# Patient Record
Sex: Male | Born: 1995 | Hispanic: Yes | Marital: Single | Smoking: Never smoker
Health system: Southern US, Community
[De-identification: ages and names within clinical notes are randomized; demographics above are authoritative.]

## PROBLEM LIST (undated history)

## (undated) DIAGNOSIS — G809 Cerebral palsy, unspecified: Secondary | ICD-10-CM

---
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
## 2014-06-22 DIAGNOSIS — Z9889 Other specified postprocedural states: Secondary | ICD-10-CM | POA: Insufficient documentation

## 2014-08-01 DIAGNOSIS — F331 Major depressive disorder, recurrent, moderate: Secondary | ICD-10-CM | POA: Insufficient documentation

## 2014-11-08 ENCOUNTER — Emergency Department (HOSPITAL_COMMUNITY)
Admission: EM | Admit: 2014-11-08 | Discharge: 2014-11-08 | Disposition: A | Discharge status: Home / Self Care | Payer: Medicare Other | Attending: Emergency Medicine | Admitting: Emergency Medicine

## 2014-11-08 ENCOUNTER — Encounter (HOSPITAL_COMMUNITY): Payer: Self-pay | Admitting: Emergency Medicine

## 2014-11-08 DIAGNOSIS — Y9289 Other specified places as the place of occurrence of the external cause: Secondary | ICD-10-CM | POA: Insufficient documentation

## 2014-11-08 DIAGNOSIS — Y288XXA Contact with other sharp object, undetermined intent, initial encounter: Secondary | ICD-10-CM | POA: Insufficient documentation

## 2014-11-08 DIAGNOSIS — Z79899 Other long term (current) drug therapy: Secondary | ICD-10-CM | POA: Insufficient documentation

## 2014-11-08 DIAGNOSIS — Y9389 Activity, other specified: Secondary | ICD-10-CM | POA: Insufficient documentation

## 2014-11-08 DIAGNOSIS — S3121XA Laceration without foreign body of penis, initial encounter: Secondary | ICD-10-CM | POA: Diagnosis present

## 2014-11-08 DIAGNOSIS — Z8669 Personal history of other diseases of the nervous system and sense organs: Secondary | ICD-10-CM | POA: Insufficient documentation

## 2014-11-08 DIAGNOSIS — Y998 Other external cause status: Secondary | ICD-10-CM | POA: Insufficient documentation

## 2014-11-08 NOTE — ED Notes (Signed)
Pt here with mother per mother small laceration to pts penis; mother sts some bleeding noted; pt with hx of CP and is non verbal

## 2014-11-08 NOTE — ED Notes (Signed)
  Bacitracin applied, lac covered with gauze.

## 2014-11-08 NOTE — ED Provider Notes (Signed)
CSN: [id number]     Arrival date & time 11/08/14  1312 History   First MD Initiated Contact with Patient 11/08/14 1327     Chief Complaint  Patient presents with  . Laceration     (Consider location/radiation/quality/duration/timing/severity/associated sxs/prior Treatment) Patient is a 19 y.o. male presenting with skin laceration. The history is provided by a parent. No language interpreter was used.  Laceration Location:  Pelvis Pelvic laceration location:  Penis Length (cm):  2.5cm Depth:  Cutaneous Quality: straight   Bleeding: controlled   Time since incident:  1 hour Injury mechanism: edge of the toilet. Pain details:    Quality:  Burning   Severity:  Mild   Timing:  Constant   Progression:  Unchanged Foreign body present:  No foreign bodies Relieved by:  Nothing Worsened by:  Nothing tried Ineffective treatments:  None tried Tetanus status:  Unknown   Past Medical History  Diagnosis Date  . Cerebral palsy    History reviewed. No pertinent past surgical history. History reviewed. No pertinent family history. History  Substance Use Topics  . Smoking status: Never Smoker   . Smokeless tobacco: Never Used  . Alcohol Use: No    Review of Systems  Constitutional: Negative for fever, chills and fatigue.  HENT: Negative for trouble swallowing.   Eyes: Negative for visual disturbance.  Respiratory: Negative for shortness of breath.   Cardiovascular: Negative for chest pain and palpitations.  Gastrointestinal: Negative for nausea, vomiting, abdominal pain and diarrhea.  Genitourinary: Negative for dysuria and difficulty urinating.  Musculoskeletal: Negative for arthralgias and neck pain.  Skin: Positive for wound. Negative for color change.  Neurological: Negative for dizziness and weakness.  Psychiatric/Behavioral: Negative for dysphoric mood.      Allergies  Review of patient's allergies indicates no known allergies.  Home Medications   Prior to  Admission medications   Medication Sig Start Date End Date Taking? Authorizing Provider  ondansetron (ZOFRAN-ODT) 4 MG disintegrating tablet Take 4 mg by mouth every 8 (eight) hours as needed. For nausea    Historical Provider, MD  PRESCRIPTION MEDICATION Take 2 mLs by mouth 3 (three) times daily. Baclofen susp. 5mg /971ml    Historical Provider, MD   BP 141/75 mmHg  Pulse 113  Temp(Src) 97.5 F (36.4 C) (Oral)  Resp 18  SpO2 96% Physical Exam  Constitutional: He is oriented to person, place, and time. He appears well-developed and well-nourished. No distress.  HENT:  Head: Normocephalic and atraumatic.  Eyes: Conjunctivae and EOM are normal.  Neck: Normal range of motion.  Cardiovascular: Normal rate and regular rhythm.  Exam reveals no gallop and no friction rub.   No murmur heard. Pulmonary/Chest: Effort normal and breath sounds normal. He has no wheezes. He has no rales. He exhibits no tenderness.  Abdominal: Soft. He exhibits no distension. There is no tenderness. There is no rebound.  Genitourinary: Testes normal and penis normal.    Circumcised.  2.5cm superficial laceration to penile shaft with bleeding controlled.   Musculoskeletal: Normal range of motion.  Neurological: He is alert and oriented to person, place, and time. Coordination normal.  Speech is goal-oriented. Moves limbs without ataxia.   Skin: Skin is warm and dry.  Psychiatric: He has a normal mood and affect. His behavior is normal.  Nursing note and vitals reviewed.   ED Course  Procedures (including critical care time) Labs Review Labs Reviewed - No data to display  Imaging Review No results found.   EKG Interpretation None  MDM   Final diagnoses:  Laceration of penis, initial encounter    1:59 PM Patient's laceration is superficial with bleeding controlled at this time. No repair needed. Laceration will be cleaned and bacitracin applied. Patient's mother understands the wound will heal  quickly on its own due to vascular area.     Emilia Beck, PA-C 11/08/14 1405  Vanetta Mulders, MD 11/08/14 1420

## 2014-11-08 NOTE — Discharge Instructions (Signed)
Keep wound clean and apply bacitracin to prevent infection. Follow up with the Primary care provider as needed. Return to the ED with worsening or concerning symptoms.

## 2014-11-16 ENCOUNTER — Telehealth: Payer: Self-pay | Admitting: *Deleted

## 2014-11-16 NOTE — Telephone Encounter (Signed)
NPI # given for One time authorization given to [facility], patient to get card changed.

## 2015-05-16 DIAGNOSIS — H0015 Chalazion left lower eyelid: Secondary | ICD-10-CM

## 2015-05-16 HISTORY — DX: Chalazion left lower eyelid: H00.15

## 2016-02-09 ENCOUNTER — Emergency Department (HOSPITAL_COMMUNITY)
Admission: EM | Admit: 2016-02-09 | Discharge: 2016-02-09 | Disposition: A | Discharge status: Home / Self Care | Payer: Medicaid Other | Attending: Emergency Medicine | Admitting: Emergency Medicine

## 2016-02-09 ENCOUNTER — Emergency Department (HOSPITAL_COMMUNITY): Payer: Medicaid Other

## 2016-02-09 ENCOUNTER — Encounter (HOSPITAL_COMMUNITY): Payer: Self-pay | Admitting: *Deleted

## 2016-02-09 DIAGNOSIS — R0602 Shortness of breath: Secondary | ICD-10-CM | POA: Insufficient documentation

## 2016-02-09 DIAGNOSIS — R110 Nausea: Secondary | ICD-10-CM

## 2016-02-09 DIAGNOSIS — R1084 Generalized abdominal pain: Secondary | ICD-10-CM

## 2016-02-09 DIAGNOSIS — R112 Nausea with vomiting, unspecified: Secondary | ICD-10-CM | POA: Diagnosis not present

## 2016-02-09 DIAGNOSIS — R079 Chest pain, unspecified: Secondary | ICD-10-CM | POA: Insufficient documentation

## 2016-02-09 DIAGNOSIS — N3000 Acute cystitis without hematuria: Secondary | ICD-10-CM

## 2016-02-09 DIAGNOSIS — R1012 Left upper quadrant pain: Secondary | ICD-10-CM | POA: Diagnosis present

## 2016-02-09 DIAGNOSIS — R197 Diarrhea, unspecified: Secondary | ICD-10-CM | POA: Diagnosis not present

## 2016-02-09 DIAGNOSIS — K5900 Constipation, unspecified: Secondary | ICD-10-CM

## 2016-02-09 LAB — COMPREHENSIVE METABOLIC PANEL
ALK PHOS: 144 U/L — AB (ref 38–126)
ALT: 20 U/L (ref 17–63)
ANION GAP: 13 (ref 5–15)
AST: 28 U/L (ref 15–41)
Albumin: 4.3 g/dL (ref 3.5–5.0)
BUN: 15 mg/dL (ref 6–20)
CALCIUM: 9.7 mg/dL (ref 8.9–10.3)
CO2: 26 mmol/L (ref 22–32)
Chloride: 103 mmol/L (ref 101–111)
Creatinine, Ser: 0.83 mg/dL (ref 0.61–1.24)
GFR calc non Af Amer: >60 mL/min (ref >60)
Glucose, Bld: 116 mg/dL — ABNORMAL HIGH (ref 65–99)
Potassium: 3.4 mmol/L — ABNORMAL LOW (ref 3.5–5.1)
SODIUM: 142 mmol/L (ref 135–145)
TOTAL PROTEIN: 8 g/dL (ref 6.5–8.1)
Total Bilirubin: 0.9 mg/dL (ref 0.3–1.2)

## 2016-02-09 LAB — URINALYSIS, ROUTINE W REFLEX MICROSCOPIC
Bilirubin Urine: NEGATIVE
Glucose, UA: 100 mg/dL — AB
Ketones, ur: 15 mg/dL — AB
LEUKOCYTES UA: NEGATIVE
Nitrite: NEGATIVE
PROTEIN: 30 mg/dL — AB
Specific Gravity, Urine: 1.019 (ref 1.005–1.030)
pH: 7.5 (ref 5.0–8.0)

## 2016-02-09 LAB — CBC
HCT: 50.6 % (ref 39.0–52.0)
Hemoglobin: 17.1 g/dL — ABNORMAL HIGH (ref 13.0–17.0)
MCH: 28.5 pg (ref 26.0–34.0)
MCHC: 33.8 g/dL (ref 30.0–36.0)
MCV: 84.5 fL (ref 78.0–100.0)
PLATELETS: 188 10*3/uL (ref 150–400)
RBC: 5.99 MIL/uL — AB (ref 4.22–5.81)
RDW: 13.1 % (ref 11.5–15.5)
WBC: 9.1 10*3/uL (ref 4.0–10.5)

## 2016-02-09 LAB — URINE MICROSCOPIC-ADD ON: WBC UA: NONE SEEN WBC/hpf (ref 0–5)

## 2016-02-09 LAB — TROPONIN I

## 2016-02-09 LAB — LIPASE, BLOOD: LIPASE: 28 U/L (ref 11–51)

## 2016-02-09 MED ORDER — CEFTRIAXONE SODIUM 1 G IJ SOLR
1.0000 g | Freq: Once | INTRAMUSCULAR | Status: AC
  Administered 2016-02-09: 1 g via INTRAVENOUS
  Filled 2016-02-09: qty 10

## 2016-02-09 MED ORDER — CEPHALEXIN 500 MG PO CAPS: 500.0000 mg | ORAL_CAPSULE | Freq: Four times a day (QID) | ORAL | Status: DC

## 2016-02-09 MED ORDER — IBUPROFEN 800 MG PO TABS: 800.0000 mg | ORAL_TABLET | Freq: Three times a day (TID) | ORAL | Status: DC

## 2016-02-09 MED ORDER — IBUPROFEN 100 MG/5ML PO SUSP
400.0000 mg | Freq: Once | ORAL | Status: AC
  Administered 2016-02-09: 400 mg via ORAL
  Filled 2016-02-09: qty 20

## 2016-02-09 MED ORDER — SODIUM CHLORIDE 0.9 % IV BOLUS (SEPSIS)
1000.0000 mL | Freq: Once | INTRAVENOUS | Status: AC
  Administered 2016-02-09: 1000 mL via INTRAVENOUS

## 2016-02-09 MED ORDER — ONDANSETRON 4 MG PO TBDP: 4.0000 mg | ORAL_TABLET | Freq: Three times a day (TID) | ORAL | Status: DC | PRN

## 2016-02-09 NOTE — ED Notes (Signed)
abd and chest pain with emesis for 3-4 days.  He has been seen at [facility] for the same   He has been constipated.  Last bm this am was diarrhea.Marland Kitchen.  Hx constipatedhe has cerebral palsy  W/c bound

## 2016-02-09 NOTE — Discharge Instructions (Signed)
You have been seen today for abdominal discomfort, constipation, and nausea. Your imaging showed no abnormalities, but her urine shows evidence of urinary tract infection. Please take all of your antibiotics until finished!   You may develop abdominal discomfort or diarrhea from the antibiotic.  You may help offset this with probiotics which you can buy or get in yogurt. Do not eat or take the probiotics until 2 hours after your antibiotic. Your other symptoms are consistent with a viral illness. Viruses do not require antibiotics (the antibiotic prescribed today is for the UTI). Treatment is symptomatic care. Drink plenty of fluids and get plenty of rest. You should be drinking at least a liter of water an hour to stay hydrated. Ibuprofen or Tylenol for pain or fever. Zofran for nausea. Follow up with PCP as needed should symptoms continue. Return to ED should symptoms worsen.  RESOURCE GUIDE  Chronic Pain Problems: Contact [facility]  [phone] Patients need to be referred by their primary care doctor.  Insufficient Money for Medicine: Contact United Way:  call "211" or [facility] [phone].  No Primary Care Doctor: - Call Health Connect  [phone] - can help you locate a primary care doctor that  accepts your insurance, provides certain services, etc. - Physician Referral Service- [phone]  Agencies that provide inexpensive medical care: - [facility]  [phone] - [facility]  [phone] - [facility]  [phone] - Women's Clinic  [phone] - Planned Parenthood  [phone] Haynes Bast[facility]  [phone]  Medicaid-accepting [facility] Providers: - [facility]- [address], [address]  [phone], Mon-Fri 9am-7pm, Sat 9am-1pm - [facility]- [address], [address] [place]  [phone] - [facility]- [address], [address]  [place]  [phone] [facility]- [address] [place] [address]  [phone] - Renaye RakersVeita Bland- [address], [address], [phone]  Only accepts [place] patients after they have their name  applied to their card  Self Pay (no insurance) in [place]: - Sickle Cell Patients: Dr Willey BladeEric Dean, [facility]  [address], [phone] - [facility]- [address]  [phone]       Redge Gainer-     [facility]- [address], [address]       -     [facility]- see information above (Speak to CitigroupPam H if you do not have insurance)       -  [facility]- [address], [phone]       -  [facility]- [address],  [phone]       -  [facility]- [address] [place] [address], [phone]       -  Dr Julio Sickssei-Bonsu-  [address], [address], [place], [phone]       -  [facility]- [address], [phone]       -  [facility]- [address] [place] [address], [phone], also [address], [phone]       -    [facility]- [address], [phone], 1st & 3rd Saturday   every month, 10am-1pm  1) Find a Doctor and Pay Out of Pocket Although you won't have to find out who is covered by your insurance plan, it  is a good idea to ask around and get recommendations. You will then need to call the office and see if the doctor you have chosen will accept you as a new patient and what types of options they offer for patients who are self-pay. Some doctors offer discounts or will set up payment plans for their patients who do not have insurance, but you will need to ask so you aren't surprised when you get to your appointment.  2) Contact Your Local Health Department Not all health departments have doctors that can see patients for sick visits, but many do, so it is worth a call to see if yours does. If you don't know where your local health department is, you can check in  your phone book. The CDC also has a tool to help you locate your state's health department, and many state websites also have listings of all of their local health departments.  3) Find a Walk-in Clinic If your illness is not likely to be very severe or complicated, you may want to try a walk in clinic. These are popping up all over the country in pharmacies, drugstores, and shopping centers. They're usually staffed by nurse practitioners or physician assistants that have been trained to treat common illnesses and complaints. They're usually fairly quick and inexpensive. However, if you have serious medical issues or chronic medical problems, these are probably not your best option  STD Testing - [facility] Department of [facility], STD Clinic, [address], [place], phone [phone] or [phone].  Monday - Friday, call for an appointment. [facility] Department of [employer], STD Clinic, [address], [place], phone [phone] or [phone].  Monday - Friday, call for an appointment.  Abuse/Neglect: [facility] Child Abuse Hotline 825-239-4835 [facility] Child Abuse Hotline [phone] (After Hours)  Emergency Shelter:  [facility] 612-395-1687  Maternity Homes: - Room at the [place] [facility] [phone] - [facility] [phone]  MRSA Hotline #:   3438876814  [facility] Resources  [facility] [place]  [facility] Dept. [address]                 [address]         [address]  Blondell Reveal Phone:  [phone]                                  Phone:  [phone]                   Phone:  [phone]  [facility], [phone] - [facility] - [phone]       -     [facility] in [place], [address]  [phone], [place] [phone] or [phone] (After Hours)   [place]  Substance Abuse Resources: - [facility] and Drug Services  [phone] - [place] [phone] - The [facility] [place] [phone] - Residential & Outpatient Substance Abuse Program  [phone]  Psychological Services: - [place], [place] [address], [place], [place]: [phone] or [phone], PicCapture.uy  Dental Assistance  If unable to pay or uninsured, contact:  [facility] or [facility]. to become qualified for the adult dental clinic.  Patients with Medicaid: [facility] [phone] [address], [place] [address] [phone]  If unable to pay, or uninsured, contact [facility] [phone]) or [facility] [phone] in [place], [place] in [facility]) to become qualified for the adult dental clinic   Other [place]- [place], [place], [place], [address], [place], [place], 2nd and 4th Thursday of the month at 6:30am.  10 clients each day by appointment, can sometimes see walk-in patients if someone does not show for an appointment. [facility]- [address] [name], [place], [address], [place], [place], [place], [address], [place] Department- [place] Department- [place] Department- [phone]

## 2016-02-09 NOTE — ED Notes (Signed)
MD at bedside. 

## 2016-02-09 NOTE — ED Provider Notes (Signed)
CSN: [id number]     Arrival date & time 02/09/16  9562 History   First MD Initiated Contact with Patient 02/09/16 0606     Chief Complaint  Patient presents with  . Emesis     (Consider location/radiation/quality/duration/timing/severity/associated sxs/prior Treatment) HPI   Miguel Myers is a 20 y.o. male, with a history of cerebral palsy, presenting to the ED with abdominal pain that began 4 days ago. Accompanied by loose stools and nausea that began last night. Pt rates his pain at 8/10, sharp, located on the left upper and lower quadrants, and radiates across the abdomen. Also accompanied by decreased appetite and oral intake. Pt was seen recently for this problem on April [facility] at [facility]. Pt has a baclofen pump, meant to relax his muscles. This pump was evaluated and found to be in its proper place. Found to be constipated at that time. Enemas and magnesium recommended. Last enema was last night at 8:30 PM. Patient's mother, at the bedside, states pt had these same symptoms when he was around 20 years old and was found to have an ileus. Her mother is concerned that this is happening again. Pt is urinating normally. Denies vomiting, fever/chills, cough, current shortness of breath or chest pain, dysuria, or any other complaints. Patient has had multiple abdominal surgeries.   Past Medical History  Diagnosis Date  . Cerebral palsy (HCC)    History reviewed. No pertinent past surgical history. No family history on file. Social History  Substance Use Topics  . Smoking status: Never Smoker   . Smokeless tobacco: Never Used  . Alcohol Use: No    Review of Systems  Constitutional: Negative for fever and chills.  Respiratory: Positive for shortness of breath.   Cardiovascular: Positive for chest pain.  Gastrointestinal: Positive for nausea, vomiting, abdominal pain, diarrhea and constipation. Negative for blood in stool.  All other systems reviewed and are negative.     Allergies  Review  of patient's allergies indicates no known allergies.  Home Medications   Prior to Admission medications   Medication Sig Start Date End Date Taking? Authorizing Provider  baclofen (LIORESAL) 0.05 MG/ML injection by Intrathecal route. 07/19/15  Yes Historical Provider, MD  mirtazapine (REMERON) 7.5 MG tablet Take 0.5 tablets by mouth every evening. 01/11/16  Yes Historical Provider, MD  ondansetron (ZOFRAN-ODT) 4 MG disintegrating tablet Take 4 mg by mouth every 8 (eight) hours as needed. For nausea   Yes Historical Provider, MD  polyethylene glycol powder (MIRALAX) powder Take 1 Dose by mouth as needed. constipation 05/05/11  Yes Historical Provider, MD  cephALEXin (KEFLEX) 500 MG capsule Take 1 capsule (500 mg total) by mouth 4 (four) times daily. 02/09/16   [name] [name], PA-C  ibuprofen (ADVIL,MOTRIN) 800 MG tablet Take 1 tablet (800 mg total) by mouth 3 (three) times daily. 02/09/16   [name] [name], PA-C  ondansetron (ZOFRAN ODT) 4 MG disintegrating tablet Take 1 tablet (4 mg total) by mouth every 8 (eight) hours as needed for nausea or vomiting. 02/09/16   [name] [name], PA-C   BP 125/84 mmHg  Pulse 100  Temp(Src) 97.7 F (36.5 C) (Oral)  Resp 17  Ht  (1.473 m)  Wt 63.504 kg  BMI 29.27 kg/m2  SpO2 99% Physical Exam  Constitutional: He is oriented to person, place, and time. He appears well-developed and well-nourished. No distress.  HENT:  Head: Normocephalic and atraumatic.  Eyes: Conjunctivae are normal. Pupils are equal, round, and reactive to light.  Neck: Neck supple.  Cardiovascular: Normal rate, regular rhythm, normal heart sounds and intact distal pulses.   Pulmonary/Chest: Effort normal and breath sounds normal. No respiratory distress. He exhibits no tenderness.  Abdominal: Soft. Normal appearance. There is tenderness in the left upper quadrant and left lower quadrant. There is no guarding.  Mild abdominal tenderness.  Musculoskeletal: He exhibits no edema or tenderness.   Lymphadenopathy:    He has no cervical adenopathy.  Neurological: He is alert and oriented to person, place, and time.  Skin: Skin is warm and dry. He is not diaphoretic.  Psychiatric: He has a normal mood and affect. His behavior is normal.  Nursing note and vitals reviewed.   ED Course  Procedures (including critical care time) Labs Review Labs Reviewed  COMPREHENSIVE METABOLIC PANEL - Abnormal; Notable for the following:    Potassium 3.4 (*)    Glucose, Bld 116 (*)    Alkaline Phosphatase 144 (*)    All other components within normal limits  CBC - Abnormal; Notable for the following:    RBC 5.99 (*)    Hemoglobin 17.1 (*)    All other components within normal limits  URINALYSIS, ROUTINE W REFLEX MICROSCOPIC (NOT AT [facility]) - Abnormal; Notable for the following:    APPearance TURBID (*)    Glucose, UA 100 (*)    Hgb urine dipstick SMALL (*)    Ketones, ur 15 (*)    Protein, ur 30 (*)    All other components within normal limits  URINE MICROSCOPIC-ADD ON - Abnormal; Notable for the following:    Squamous Epithelial / LPF 0-5 (*)    Bacteria, UA MANY (*)    All other components within normal limits  URINE CULTURE  LIPASE, BLOOD  TROPONIN I    Imaging Review Dg Chest 1 View  02/09/2016  CLINICAL DATA:  Chest pain and nausea.  Cerebral palsy. EXAM: CHEST 1 VIEW COMPARISON:  None. FINDINGS: Normal sized heart. Clear lungs. Poor inspiration. Unremarkable bones. IMPRESSION: No acute abnormality. Electronically Signed   By: Beckie Salts M.D.   On: 02/09/2016 07:40   Dg Abd 1 View  02/09/2016  CLINICAL DATA:  Chest pain and nausea.  Cerebral palsy. EXAM: ABDOMEN - 1 VIEW COMPARISON:  None. FINDINGS: Dysplastic pelvic bones and hips compatible with the history of cerebral palsy. Metallic device overlying the left lower abdomen and upper pelvis with a small lead or catheter extending toward the spine. Normal bowel gas pattern. IMPRESSION: No acute abnormality. Electronically Signed    By: Beckie Salts M.D.   On: 02/09/2016 07:39   I have personally reviewed and evaluated these images and lab results as part of my medical decision-making.   EKG Interpretation   Date/Time:  Saturday February 09 2016 05:39:43 EDT Ventricular Rate:  107 PR Interval:  128 QRS Duration: 76 QT Interval:  336 QTC Calculation: 448 R Axis:   96 Text Interpretation:  Sinus tachycardia Rightward axis Borderline ECG  Confirmed by Lincoln Brigham [id number]) on 02/09/2016 8:03:48 AM      Medications  sodium chloride 0.9 % bolus 1,000 mL (0 mLs Intravenous Stopped 02/09/16 0817)  cefTRIAXone (ROCEPHIN) 1 g in dextrose 5 % 50 mL IVPB (0 g Intravenous Stopped 02/09/16 0847)  ibuprofen (ADVIL,MOTRIN) 100 MG/5ML suspension 400 mg (400 mg Oral Given 02/09/16 0918)   Orders Placed This Encounter  Procedures  . Urine culture  . DG Abd 1 View  . DG Chest 1 View  . Lipase, blood  .  Comprehensive metabolic panel  . CBC  . Urinalysis, Routine w reflex microscopic (not at [facility])  . Troponin I  . Urine microscopic-add on  . Diet NPO time specified  . Saline Lock IV, Maintain IV access  . EKG 12-Lead  . ED EKG    MDM   Final diagnoses:  Constipation, unspecified constipation type  Generalized abdominal pain  Acute cystitis without hematuria    Peter GarterKevin Scrivener presents with abdominal pain and constipation for the last 3 days. Now accompanied by soft stools.  Findings and plan of care discussed with Tilden FossaElizabeth Rees, MD. Dr. Madilyn Hookees personally evaluated and examined this patient.  Suspect the patient's soft stools are due to the enema use. Signs of UTI on UA acknowledged and treatment initiated. X-rays show no acute abnormalities. Suspect patient's discomfort is due to possible viral illness. Patient is nontoxic appearing, afebrile, not tachycardic on my exam, not tachypneic, maintains SPO2 of 97-99% on room air, and is in no apparent distress. Patient has no signs of sepsis or other serious or life-threatening  condition. Upon reassessment, patient states that he is no longer in pain. Patient currently denies any complaints. Patient to follow up with PCP should symptoms continue. Home care and return precautions discussed. Patient and patient's mother voice understanding of these instructions, accept the plan, and are comfortable with discharge.  Filed Vitals:   02/09/16 0645 02/09/16 0700 02/09/16 0703 02/09/16 0745  BP: 143/82 126/76  125/77  Pulse: 95 84  106  Temp:      Resp:      Height:      Weight:      SpO2: 99% 97% 99% 97%   Filed Vitals:   02/09/16 0845 02/09/16 0915 02/09/16 0925 02/09/16 0927  BP: 122/73 125/84    Pulse: 102 111 100   Temp:    97.7 F (36.5 C)  TempSrc:    Oral  Resp:   17   Height:      Weight:      SpO2: 99% 100% 99%         Anselm PancoastShawn C [name], PA-C 02/09/16 0930  Tilden FossaElizabeth Rees, MD 02/10/16 [id number]

## 2016-02-10 LAB — URINE CULTURE

## 2016-02-22 ENCOUNTER — Emergency Department (HOSPITAL_COMMUNITY): Payer: Medicaid Other

## 2016-02-22 ENCOUNTER — Encounter (HOSPITAL_COMMUNITY): Payer: Self-pay

## 2016-02-22 ENCOUNTER — Emergency Department (HOSPITAL_COMMUNITY)
Admission: EM | Admit: 2016-02-22 | Discharge: 2016-02-22 | Disposition: A | Discharge status: Home / Self Care | Payer: Medicaid Other | Attending: Emergency Medicine | Admitting: Emergency Medicine

## 2016-02-22 DIAGNOSIS — R079 Chest pain, unspecified: Secondary | ICD-10-CM | POA: Diagnosis present

## 2016-02-22 DIAGNOSIS — R000 Tachycardia, unspecified: Secondary | ICD-10-CM | POA: Diagnosis not present

## 2016-02-22 DIAGNOSIS — Z8669 Personal history of other diseases of the nervous system and sense organs: Secondary | ICD-10-CM | POA: Diagnosis not present

## 2016-02-22 DIAGNOSIS — R0789 Other chest pain: Secondary | ICD-10-CM | POA: Diagnosis not present

## 2016-02-22 LAB — BASIC METABOLIC PANEL
ANION GAP: 13 (ref 5–15)
BUN: 15 mg/dL (ref 6–20)
CHLORIDE: 104 mmol/L (ref 101–111)
CO2: 22 mmol/L (ref 22–32)
Calcium: 10 mg/dL (ref 8.9–10.3)
Creatinine, Ser: 0.84 mg/dL (ref 0.61–1.24)
GFR calc Af Amer: >60 mL/min (ref >60)
GLUCOSE: 111 mg/dL — AB (ref 65–99)
POTASSIUM: 3.5 mmol/L (ref 3.5–5.1)
SODIUM: 139 mmol/L (ref 135–145)

## 2016-02-22 LAB — CBC
HCT: 50 % (ref 39.0–52.0)
Hemoglobin: 17.1 g/dL — ABNORMAL HIGH (ref 13.0–17.0)
MCH: 28.9 pg (ref 26.0–34.0)
MCHC: 34.2 g/dL (ref 30.0–36.0)
MCV: 84.5 fL (ref 78.0–100.0)
Platelets: 204 10*3/uL (ref 150–400)
RBC: 5.92 MIL/uL — ABNORMAL HIGH (ref 4.22–5.81)
RDW: 12.8 % (ref 11.5–15.5)
WBC: 9.6 10*3/uL (ref 4.0–10.5)

## 2016-02-22 LAB — I-STAT TROPONIN, ED: TROPONIN I, POC: 0 ng/mL (ref 0.00–0.08)

## 2016-02-22 LAB — D-DIMER, QUANTITATIVE: D-Dimer, Quant: 0.27 ug/mL-FEU (ref 0.00–0.50)

## 2016-02-22 MED ORDER — ONDANSETRON 4 MG PO TBDP
4.0000 mg | ORAL_TABLET | Freq: Once | ORAL | Status: AC
  Administered 2016-02-22: 4 mg via ORAL
  Filled 2016-02-22: qty 1

## 2016-02-22 MED ORDER — FENTANYL CITRATE (PF) 100 MCG/2ML IJ SOLN
50.0000 ug | INTRAMUSCULAR | Status: DC | PRN
  Administered 2016-02-22: 50 ug via INTRAVENOUS
  Filled 2016-02-22: qty 2

## 2016-02-22 MED ORDER — SODIUM CHLORIDE 0.9 % IV BOLUS (SEPSIS)
1000.0000 mL | Freq: Once | INTRAVENOUS | Status: AC
  Administered 2016-02-22: 1000 mL via INTRAVENOUS

## 2016-02-22 NOTE — ED Provider Notes (Signed)
CSN: [id number]     Arrival date & time 02/22/16  1915 History   First MD Initiated Contact with Patient 02/22/16 1924     Chief Complaint  Patient presents with  . Chest Pain     (Consider location/radiation/quality/duration/timing/severity/associated sxs/prior Treatment) HPI Comments: 20 year old male with cerebral palsy presents with fast heart rate and chest pain. Chest pain worse with movement. Patient denies recent surgery, blood clot history, unilateral leg swelling. No fevers or chills or cough. Patient was recently seen in the ER for vomiting flank pain and possible urine infection. Patient states vomiting diarrhea and side pain resolved however anterior chest pain sharp intermittent is new. No cardiac history. No diaphoresis, nonradiating. 3 days.  Patient is a 20 y.o. male presenting with chest pain. The history is provided by the patient.  Chest Pain Associated symptoms: no abdominal pain, no back pain, no fever, no headache, no shortness of breath and not vomiting     Past Medical History  Diagnosis Date  . Cerebral palsy (HCC)    History reviewed. No pertinent past surgical history. History reviewed. No pertinent family history. Social History  Substance Use Topics  . Smoking status: Never Smoker   . Smokeless tobacco: Never Used  . Alcohol Use: No    Review of Systems  Constitutional: Negative for fever and chills.  HENT: Negative for congestion.   Eyes: Negative for visual disturbance.  Respiratory: Negative for shortness of breath.   Cardiovascular: Positive for chest pain.  Gastrointestinal: Negative for vomiting and abdominal pain.  Genitourinary: Negative for dysuria and flank pain.  Musculoskeletal: Negative for back pain, neck pain and neck stiffness.  Skin: Negative for rash.  Neurological: Negative for light-headedness and headaches.      Allergies  Review of patient's allergies indicates no known allergies.  Home Medications   Prior to  Admission medications   Medication Sig Start Date End Date Taking? Authorizing Provider  polyethylene glycol powder (MIRALAX) powder Take 0.5 Containers by mouth daily as needed for mild constipation. constipation 05/05/11  Yes Historical Provider, MD  cephALEXin (KEFLEX) 500 MG capsule Take 1 capsule (500 mg total) by mouth 4 (four) times daily. Patient not taking: Reported on 02/22/2016 02/09/16   [name] [name], PA-C  ibuprofen (ADVIL,MOTRIN) 800 MG tablet Take 1 tablet (800 mg total) by mouth 3 (three) times daily. Patient not taking: Reported on 02/22/2016 02/09/16   [name] [name], PA-C  ondansetron (ZOFRAN ODT) 4 MG disintegrating tablet Take 1 tablet (4 mg total) by mouth every 8 (eight) hours as needed for nausea or vomiting. Patient not taking: Reported on 02/22/2016 02/09/16   [name] [name], PA-C   BP 116/73 mmHg  Pulse 84  Temp(Src) 99.6 F (37.6 C) (Rectal)  Resp 13  Ht  (1.473 m)  Wt 140 lb (63.504 kg)  BMI 29.27 kg/m2  SpO2 96% Physical Exam  Constitutional: He is oriented to person, place, and time. He appears well-developed and well-nourished.  HENT:  Head: Normocephalic and atraumatic.  Eyes: Right eye exhibits no discharge. Left eye exhibits no discharge.  Neck: Normal range of motion. Neck supple. No tracheal deviation present.  Cardiovascular: Regular rhythm.  Tachycardia present.   Pulmonary/Chest: Effort normal and breath sounds normal.  Abdominal: Soft. He exhibits no distension. There is no tenderness. There is no guarding.  Musculoskeletal: He exhibits tenderness (tender chest wall). He exhibits no edema.  Neurological: He is alert and oriented to person, place, and time.  Skin: Skin is warm.  No rash noted.  Psychiatric: He has a normal mood and affect.  Nursing note and vitals reviewed.   ED Course  Procedures (including critical care time) Labs Review Labs Reviewed  BASIC METABOLIC PANEL - Abnormal; Notable for the following:    Glucose, Bld 111 (*)    All  other components within normal limits  CBC - Abnormal; Notable for the following:    RBC 5.92 (*)    Hemoglobin 17.1 (*)    All other components within normal limits  D-DIMER, QUANTITATIVE (NOT AT [facility])  Rosezena SensorI-STAT TROPOININ, ED  I-STAT CG4 LACTIC ACID, ED    Imaging Review Dg Chest 1 View  02/22/2016  CLINICAL DATA:  Chest pain for 3 days. EXAM: CHEST 1 VIEW COMPARISON:  02/09/2016 FINDINGS: Lung volumes are low. The cardiomediastinal contours are normal. The lungs are clear. Pulmonary vasculature is normal. No consolidation, pleural effusion, or pneumothorax. No acute osseous abnormalities are seen. Probable bone island in the right glenoid unchanged. IMPRESSION: Low lung volumes without acute process. Electronically Signed   By: Rubye OaksMelanie  Ehinger M.D.   On: 02/22/2016 20:04   I have personally reviewed and evaluated these images and lab results as part of my medical decision-making.   EKG Interpretation   Date/Time:  Friday February 22 2016 19:15:31 EDT Ventricular Rate:  122 PR Interval:  120 QRS Duration: 77 QT Interval:  311 QTC Calculation: 443 R Axis:   91 Text Interpretation:  Sinus tachycardia Borderline right axis deviation  Baseline wander in lead(s) V3 similar to previous Confirmed by Faron Tudisco  MD,  Maximos Zayas ([id number]) on 02/22/2016 7:24:47 PM      MDM   Final diagnoses:  Sinus tachycardia (HCC)  Nonspecific chest pain   Patient presents with intermittent sharp chest pain and sinus tachycardia for 3 days. Patient low risk blood clot, d-dimer negative. Patient's vitals returned to normal and symptoms resolved with pain meds and IV fluids. No signs of infection on exam. Mother comfortable close outpatient follow-up discussed importance of a primary doctor locally.  Results and differential diagnosis were discussed with the patient/parent/guardian. Xrays were independently reviewed by myself.  Close follow up outpatient was discussed, comfortable with the plan.   Medications   fentaNYL (SUBLIMAZE) injection 50 mcg (50 mcg Intravenous Given 02/22/16 2113)  sodium chloride 0.9 % bolus 1,000 mL (0 mLs Intravenous Stopped 02/22/16 2257)  ondansetron (ZOFRAN-ODT) disintegrating tablet 4 mg (4 mg Oral Given 02/22/16 2256)    Filed Vitals:   02/22/16 2100 02/22/16 2130 02/22/16 2145 02/22/16 2215  BP: 115/65 116/61 122/69 116/73  Pulse: 103 89  84  Temp:      TempSrc:      Resp: 18 13 12 13   Height:      Weight:      SpO2: 98% 96% 97% 96%    Final diagnoses:  Sinus tachycardia (HCC)  Nonspecific chest pain       Blane OharaJoshua Airam Runions, MD 02/22/16 2307

## 2016-02-22 NOTE — ED Notes (Signed)
Per EMS, pt has cerebral palsy, pt complains of CP central and sharp non radiating, increased with palpation and movement. Some redness noted to chest, pt states "i've been rubbing it" Pt sinus tachycardic at 140bpm. Denies any other sx. Pt acting normal per caregiver. Pt states this has been going on for 3 days and was evaluated for the same 10 days ago here. 12 lead showed sinus tach. BP 120/82, HR 140, RR 16, 98% on RA. Pt alert and oriented x 4.

## 2016-02-22 NOTE — Discharge Instructions (Signed)
If you were given medicines take as directed.  If you are on coumadin or contraceptives realize their levels and effectiveness is altered by many different medicines.  If you have any reaction (rash, tongues swelling, other) to the medicines stop taking and see a physician.    If your blood pressure was elevated in the ER make sure you follow up for management with a primary doctor or return for chest pain, shortness of breath or stroke symptoms.  Please follow up as directed and return to the ER or see a physician for new or worsening symptoms.  Thank you. Filed Vitals:   02/22/16 2100 02/22/16 2130 02/22/16 2145 02/22/16 2215  BP: 115/65 116/61 122/69 116/73  Pulse: 103 89  84  Temp:      TempSrc:      Resp: 18 13 12 13   Height:      Weight:      SpO2: 98% 96% 97% 96%

## 2016-02-22 NOTE — ED Notes (Signed)
Pt stated that he is a little nauseous, Dr Jodi MourningZavitz notified and to order zofran

## 2016-09-22 ENCOUNTER — Emergency Department (HOSPITAL_COMMUNITY)
Admission: EM | Admit: 2016-09-22 | Discharge: 2016-09-22 | Disposition: A | Discharge status: Home / Self Care | Payer: Medicare Other | Attending: Emergency Medicine | Admitting: Emergency Medicine

## 2016-09-22 ENCOUNTER — Emergency Department (HOSPITAL_COMMUNITY): Payer: Medicare Other

## 2016-09-22 ENCOUNTER — Encounter (HOSPITAL_COMMUNITY): Payer: Self-pay | Admitting: *Deleted

## 2016-09-22 DIAGNOSIS — Z4889 Encounter for other specified surgical aftercare: Secondary | ICD-10-CM

## 2016-09-22 DIAGNOSIS — Z4801 Encounter for change or removal of surgical wound dressing: Secondary | ICD-10-CM | POA: Insufficient documentation

## 2016-09-22 DIAGNOSIS — R112 Nausea with vomiting, unspecified: Secondary | ICD-10-CM | POA: Insufficient documentation

## 2016-09-22 DIAGNOSIS — R42 Dizziness and giddiness: Secondary | ICD-10-CM | POA: Diagnosis not present

## 2016-09-22 DIAGNOSIS — R933 Abnormal findings on diagnostic imaging of other parts of digestive tract: Secondary | ICD-10-CM | POA: Insufficient documentation

## 2016-09-22 LAB — COMPREHENSIVE METABOLIC PANEL
ALK PHOS: 127 U/L — AB (ref 38–126)
ALT: 21 U/L (ref 17–63)
AST: 21 U/L (ref 15–41)
Albumin: 4.4 g/dL (ref 3.5–5.0)
Anion gap: 11 (ref 5–15)
BILIRUBIN TOTAL: 0.3 mg/dL (ref 0.3–1.2)
BUN: 14 mg/dL (ref 6–20)
CALCIUM: 9.6 mg/dL (ref 8.9–10.3)
CHLORIDE: 104 mmol/L (ref 101–111)
CO2: 23 mmol/L (ref 22–32)
CREATININE: 0.8 mg/dL (ref 0.61–1.24)
GFR calc Af Amer: >60 mL/min (ref >60)
Glucose, Bld: 134 mg/dL — ABNORMAL HIGH (ref 65–99)
Potassium: 3.2 mmol/L — ABNORMAL LOW (ref 3.5–5.1)
Sodium: 138 mmol/L (ref 135–145)
TOTAL PROTEIN: 7.7 g/dL (ref 6.5–8.1)

## 2016-09-22 LAB — LIPASE, BLOOD: Lipase: 30 U/L (ref 11–51)

## 2016-09-22 LAB — CBC
HCT: 48 % (ref 39.0–52.0)
Hemoglobin: 16.6 g/dL (ref 13.0–17.0)
MCH: 28.9 pg (ref 26.0–34.0)
MCHC: 34.6 g/dL (ref 30.0–36.0)
MCV: 83.6 fL (ref 78.0–100.0)
PLATELETS: 218 10*3/uL (ref 150–400)
RBC: 5.74 MIL/uL (ref 4.22–5.81)
RDW: 13.3 % (ref 11.5–15.5)
WBC: 13.1 10*3/uL — AB (ref 4.0–10.5)

## 2016-09-22 LAB — URINE MICROSCOPIC-ADD ON
Bacteria, UA: NONE SEEN
RBC / HPF: NONE SEEN RBC/hpf (ref 0–5)
Squamous Epithelial / LPF: NONE SEEN
WBC, UA: NONE SEEN WBC/hpf (ref 0–5)

## 2016-09-22 LAB — URINALYSIS, ROUTINE W REFLEX MICROSCOPIC
Bilirubin Urine: NEGATIVE
GLUCOSE, UA: 250 mg/dL — AB
HGB URINE DIPSTICK: NEGATIVE
KETONES UR: NEGATIVE mg/dL
LEUKOCYTES UA: NEGATIVE
Nitrite: NEGATIVE
PROTEIN: 30 mg/dL — AB
Specific Gravity, Urine: 1.018 (ref 1.005–1.030)
pH: 8.5 — ABNORMAL HIGH (ref 5.0–8.0)

## 2016-09-22 MED ORDER — IOPAMIDOL (ISOVUE-300) INJECTION 61%
INTRAVENOUS | Status: AC
  Filled 2016-09-22: qty 30

## 2016-09-22 MED ORDER — ONDANSETRON 4 MG PO TBDP
ORAL_TABLET | ORAL | Status: AC
  Filled 2016-09-22: qty 1

## 2016-09-22 MED ORDER — SODIUM CHLORIDE 0.9 % IV BOLUS (SEPSIS)
1000.0000 mL | Freq: Once | INTRAVENOUS | Status: AC
  Administered 2016-09-22: 1000 mL via INTRAVENOUS

## 2016-09-22 MED ORDER — IOPAMIDOL (ISOVUE-300) INJECTION 61%
INTRAVENOUS | Status: AC
  Administered 2016-09-22: 100 mL
  Filled 2016-09-22: qty 100

## 2016-09-22 MED ORDER — ONDANSETRON HCL 4 MG/2ML IJ SOLN
4.0000 mg | Freq: Once | INTRAMUSCULAR | Status: AC
  Administered 2016-09-22: 4 mg via INTRAVENOUS
  Filled 2016-09-22: qty 2

## 2016-09-22 MED ORDER — ONDANSETRON 4 MG PO TBDP
4.0000 mg | ORAL_TABLET | Freq: Once | ORAL | Status: AC
  Administered 2016-09-22: 4 mg via ORAL

## 2016-09-22 NOTE — ED Notes (Signed)
Three attempts made to start IV consult placed

## 2016-09-22 NOTE — ED Notes (Signed)
Patient transported to CT 

## 2016-09-22 NOTE — ED Notes (Signed)
Pt stable, family at bedside, states understanding of discharge instructions 

## 2016-09-22 NOTE — ED Provider Notes (Signed)
10:40 PM Patient reassessed. He denies any symptoms of nausea or vomiting. No complaints of lightheadedness or dizziness. No headache. Parents deny any change from baseline mental status. CT imaging reviewed which is reassuring. There are post surgical changes seen on CT abdomen and pelvis. Unable to exclude abscess, though clinically my suspicion for this is low. Images provided below. Labs reviewed. There is a mild leukocytosis. This is in a recent operative setting and not far from his baseline 7 months ago. The patient is well-appearing, smiling. He is in no distress and appears nontoxic. Given his reassuring workup, I believe he is stable for outpatient follow-[facility] within the next 24 hours. Family is agreeable to plan and comfortable with discharge. Return precautions discussed and provided. Patient discharged in stable condition. Family with no unaddressed concerns.   Results for orders placed or performed during the hospital encounter of 09/22/16  Lipase, blood  Result Value Ref Range   Lipase 30 11 - 51 U/L  Comprehensive metabolic panel  Result Value Ref Range   Sodium 138 135 - 145 mmol/L   Potassium 3.2 (L) 3.5 - 5.1 mmol/L   Chloride 104 101 - 111 mmol/L   CO2 23 22 - 32 mmol/L   Glucose, Bld 134 (H) 65 - 99 mg/dL   BUN 14 6 - 20 mg/dL   Creatinine, Ser 1.610.80 0.61 - 1.24 mg/dL   Calcium 9.6 8.9 - 09.610.3 mg/dL   Total Protein 7.7 6.5 - 8.1 g/dL   Albumin 4.4 3.5 - 5.0 g/dL   AST 21 15 - 41 U/L   ALT 21 17 - 63 U/L   Alkaline Phosphatase 127 (H) 38 - 126 U/L   Total Bilirubin 0.3 0.3 - 1.2 mg/dL   GFR calc non Af Amer >60 >60 mL/min   GFR calc Af Amer >60 >60 mL/min   Anion gap 11 5 - 15  CBC  Result Value Ref Range   WBC 13.1 (H) 4.0 - 10.5 K/uL   RBC 5.74 4.22 - 5.81 MIL/uL   Hemoglobin 16.6 13.0 - 17.0 g/dL   HCT 04.548.0 40.939.0 - 81.152.0 %   MCV 83.6 78.0 - 100.0 fL   MCH 28.9 26.0 - 34.0 pg   MCHC 34.6 30.0 - 36.0 g/dL   RDW 91.413.3 78.211.5 - 95.615.5 %   Platelets 218 150 - 400 K/uL  Urinalysis, Routine w reflex microscopic  Result Value Ref Range   Color, Urine YELLOW YELLOW   APPearance TURBID (A) CLEAR   Specific Gravity, Urine 1.018 1.005 - 1.030   pH 8.5 (H) 5.0 - 8.0   Glucose, UA 250 (A) NEGATIVE mg/dL   Hgb urine dipstick NEGATIVE NEGATIVE   Bilirubin Urine NEGATIVE NEGATIVE   Ketones, ur NEGATIVE NEGATIVE mg/dL   Protein, ur 30 (A) NEGATIVE mg/dL   Nitrite NEGATIVE NEGATIVE   Leukocytes, UA NEGATIVE NEGATIVE  Urine microscopic-add on  Result Value Ref Range   Squamous Epithelial / LPF NONE SEEN NONE SEEN   WBC, UA NONE SEEN 0 - 5 WBC/hpf   RBC / HPF NONE SEEN 0 - 5 RBC/hpf   Bacteria, UA NONE SEEN NONE SEEN   Urine-Other AMORPHOUS URATES/PHOSPHATES    Ct Head Wo Contrast  Result Date: 09/22/2016 CLINICAL DATA:  20 year old male with history of cerebral palsy. Patient's parents noted excess of vomiting and dizziness. Baclofen pump removed 1 and half weeks ago. Yesterday fluid started leaking from site. Patient denies pain. Initial encounter. EXAM: CT HEAD WITHOUT  CONTRAST TECHNIQUE: Contiguous axial images were obtained from the base of the skull through the vertex without intravenous contrast. COMPARISON:  None. FINDINGS: Brain: No intracranial hemorrhage or CT evidence of large acute infarct. Agenesis of the corpus callosum. Splaying of the lateral ventricles without hydrocephalus. Small pituitary gland. No intracranial mass lesion noted on this unenhanced exam. Vascular: No hyperdense vessel. Skull: No acute abnormality. Sinuses/Orbits: No acute orbital abnormality. Visualized sinuses, mastoid air cells and middle ear cavities are clear. Other: Nonspecific right frontal subcutaneous thickening. IMPRESSION: No intracranial hemorrhage or CT evidence of large acute infarct. Agenesis of the corpus callosum. Splaying of the lateral ventricles without hydrocephalus. Small pituitary gland. Nonspecific right frontal subcutaneous  thickening. Electronically Signed   By: Lacy DuverneySteven  Olson M.D.   On: 09/22/2016 16:01   Ct Abdomen Pelvis W Contrast  Result Date: 09/22/2016 CLINICAL DATA:  Nausea and vomiting, dizziness, and leaking of clear fluid from a surgical incision after baclofen pump removal performed 2 weeks ago. History of cervical palsy. EXAM: CT ABDOMEN AND PELVIS WITH CONTRAST TECHNIQUE: Multidetector CT imaging of the abdomen and pelvis was performed using the standard protocol following bolus administration of intravenous contrast. CONTRAST:  100 ml ISOVUE-300 IOPAMIDOL (ISOVUE-300) INJECTION 61% COMPARISON:  None. FINDINGS: Lower chest: Atelectasis in the lung bases. Hepatobiliary: No focal liver abnormality is seen. No gallstones, gallbladder wall thickening, or biliary dilatation. Pancreas: Unremarkable. No pancreatic ductal dilatation or surrounding inflammatory changes. Spleen: Normal in size without focal abnormality. Adrenals/Urinary Tract: Adrenal glands are unremarkable. Kidneys are normal, without renal calculi, focal lesion, or hydronephrosis. Bladder wall is mildly thickened which may indicate cystitis or neurogenic bladder. Stomach/Bowel: The stomach is fluid-filled without abnormal distention or wall thickening. The proximal duodenum is not dilated with transition zone at the level of the superior mesenteric artery suggesting superior mesenteric artery syndrome. Contrast material is demonstrated through to the colon suggesting no evidence of complete obstruction. Remainder the small bowel is decompressed. Colon is not abnormally distended. No wall thickening is appreciated. Moderately prominent stool in the rectum. Appendix is not identified. Vascular/Lymphatic: No significant vascular findings are present. No enlarged abdominal or pelvic lymph nodes. Reproductive: Prostate gland is enlarged for age, measuring about 3.9 cm diameter. Soft tissue nodular structure demonstrated in the right inguinal canal may represent  undescended testicle. Other: There is a focal collection of gas and fluid in the left lower quadrant anterior subcutaneous fat. The collection measures about 6.4 x 0.8 cm in size. This is likely the site of previous pump removal. The appearance is nonspecific and could indicate residual postoperative fluid collection although infection/abscess is not excluded. No free air or free fluid within the abdomen. Abdominal wall musculature appears intact. Musculoskeletal: No acute or significant osseous findings. IMPRESSION: 1. Loculated subcutaneous fluid and gas collection in the left lower quadrant anterior abdominal wall fat consistent with location of previous pump removal. This may represent postoperative fluid collection although abscess not excluded. 2. 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 3. Dilatation of the proximal duodenum with transition zone at the superior mesenteric artery consistent with a superior mesenteric artery syndrome. 4. Mild bladder wall thickening may indicate cystitis or neurogenic bladder. Electronically Signed   By: Burman NievesWilliam  Stevens M.D.   On: 09/22/2016 21:53     ^LLQ surgical site; nontender to palpation. No erythema, induration, or purulent drainage.   ^Lumbar incision site; scant clear drainage which is odorless and without blood. Area is nontender to palpation. No purulence.    Antony MaduraKelly Marae Cottrell,  PA-C 09/22/16 2251    Rolland Porter, MD 10/04/16 [phone]

## 2016-09-22 NOTE — ED Notes (Signed)
IV team at bedside 

## 2016-09-22 NOTE — ED Notes (Signed)
Patient returned from CT

## 2016-09-22 NOTE — ED Triage Notes (Signed)
Pt's parents picked him up from school for excessive vomiting and dizziness.  Baclofen pump removed 1.5 weeks ago and yesterday fluid began leaking out of site.  Pt denies pain.  Actively vomiting in triage.

## 2016-09-22 NOTE — ED Provider Notes (Signed)
[facility]-EMERGENCY DEPT Provider Note   CSN: [id number] Arrival date & time: 09/22/16  1234     History   Chief Complaint Chief Complaint  Patient presents with  . Emesis    HPI Miguel HeadingKevin Merrihew Jr. is a 20 y.o. male.  HPI   History gathered primarily from mother.   Pt with hx cerebral palsy p/w N/V, dizziness, and leaking clear fluid from surgical incision from baclofen pump removal, [facility]. Pt denies any pain anywhere.  Had normal BM yesterday.  Pt unable to described dizziness, differentiate from lightheadedness, but worse with change in position during exam.   Baclofen pump was removed after 3 years because it was not helping him.   Level V caveat for decreased ability to communicate due to underlying medical condition.     Past Medical History:  Diagnosis Date  . Cerebral palsy [facility](HCC)     Patient Active Problem List   Diagnosis Date Noted  . Cerebral palsy (HCC) 09/15/2012    History reviewed. No pertinent surgical history.     Home Medications    Prior to Admission medications   Medication Sig Start Date End Date Taking? Authorizing Provider  baclofen (LIORESAL) 10 MG tablet Take 10 mg by mouth 3 (three) times daily.   Yes Historical Provider, MD  polyethylene glycol powder (MIRALAX) powder Take 0.5 Containers by mouth daily as needed for mild constipation. constipation 05/05/11  Yes Historical Provider, MD  traZODone (DESYREL) 50 MG tablet Take 50 mg by mouth at bedtime.   Yes Historical Provider, MD  cephALEXin (KEFLEX) 500 MG capsule Take 1 capsule (500 mg total) by mouth 4 (four) times daily. Patient not taking: Reported on 09/22/2016 02/09/16   [name] [name], PA-C  ibuprofen (ADVIL,MOTRIN) 800 MG tablet Take 1 tablet (800 mg total) by mouth 3 (three) times daily. Patient not taking: Reported on 09/22/2016 02/09/16   [name] [name], PA-C  ondansetron (ZOFRAN ODT) 4 MG disintegrating tablet Take 1 tablet (4 mg total) by mouth every 8  (eight) hours as needed for nausea or vomiting. Patient not taking: Reported on 09/22/2016 02/09/16   Anselm PancoastShawn C [name], PA-C    Family History No family history on file.  Social History Social History  Substance Use Topics  . Smoking status: Never Smoker  . Smokeless tobacco: Never Used  . Alcohol use No     Allergies   Patient has no known allergies.   Review of Systems Review of Systems  Unable to perform ROS: Other     Physical Exam Updated Vital Signs BP 143/85   Pulse (!) 127   Temp 98.1 F (36.7 C) (Oral)   Resp 20   Ht 5' (1.524 m)   Wt 59 kg   SpO2 99%   BMI 25.39 kg/m   Physical Exam  Constitutional: He appears well-developed and well-nourished. No distress.  HENT:  Head: Normocephalic and atraumatic.  Neck: Neck supple.  Cardiovascular: Normal rate and regular rhythm.   Pulmonary/Chest: Effort normal and breath sounds normal. No respiratory distress. He has no wheezes. He has no rales.  Abdominal: Soft. He exhibits no distension and no mass. There is no tenderness. There is no rebound and no guarding.  Neurological: He is alert. He exhibits normal muscle tone.  Skin: He is not diaphoretic.  Lumbar spine with incision covered in purple plastic/glue.  No erythema, edema, warmth, discharge, or tenderness.    Left abdomen with surgical incision also with surgical glue in  place.  No erythema, edema, warmth, discharge, or tenderness   Nursing note and vitals reviewed.    ED Treatments / Results  Labs (all labs ordered are listed, but only abnormal results are displayed) Labs Reviewed  COMPREHENSIVE METABOLIC PANEL - Abnormal; Notable for the following:       Result Value   Potassium 3.2 (*)    Glucose, Bld 134 (*)    Alkaline Phosphatase 127 (*)    All other components within normal limits  CBC - Abnormal; Notable for the following:    WBC 13.1 (*)    All other components within normal limits  URINALYSIS, ROUTINE W REFLEX MICROSCOPIC (NOT AT [facility]) -  Abnormal; Notable for the following:    APPearance TURBID (*)    pH 8.5 (*)    Glucose, UA 250 (*)    Protein, ur 30 (*)    All other components within normal limits  LIPASE, BLOOD  URINE MICROSCOPIC-ADD ON    EKG  EKG Interpretation None       Radiology Ct Head Wo Contrast  Result Date: 09/22/2016 CLINICAL DATA:  20 year old male with history of cerebral palsy. Patient's parents noted excess of vomiting and dizziness. Baclofen pump removed 1 and half weeks ago. Yesterday fluid started leaking from site. Patient denies pain. Initial encounter. EXAM: CT HEAD WITHOUT CONTRAST TECHNIQUE: Contiguous axial images were obtained from the base of the skull through the vertex without intravenous contrast. COMPARISON:  None. FINDINGS: Brain: No intracranial hemorrhage or CT evidence of large acute infarct. Agenesis of the corpus callosum. Splaying of the lateral ventricles without hydrocephalus. Small pituitary gland. No intracranial mass lesion noted on this unenhanced exam. Vascular: No hyperdense vessel. Skull: No acute abnormality. Sinuses/Orbits: No acute orbital abnormality. Visualized sinuses, mastoid air cells and middle ear cavities are clear. Other: Nonspecific right frontal subcutaneous thickening. IMPRESSION: No intracranial hemorrhage or CT evidence of large acute infarct. Agenesis of the corpus callosum. Splaying of the lateral ventricles without hydrocephalus. Small pituitary gland. Nonspecific right frontal subcutaneous thickening. Electronically Signed   By: Lacy DuverneySteven  Olson M.D.   On: 09/22/2016 16:01    Procedures Procedures (including critical care time)  Medications Ordered in ED Medications  iopamidol (ISOVUE-300) 61 % injection (not administered)  sodium chloride 0.9 % bolus 1,000 mL (not administered)  ondansetron (ZOFRAN-ODT) disintegrating tablet 4 mg (4 mg Oral Given 09/22/16 1314)  sodium chloride 0.9 % bolus 1,000 mL (0 mLs Intravenous Stopped 09/22/16 1916)    ondansetron (ZOFRAN) injection 4 mg (4 mg Intravenous Given 09/22/16 1503)     Initial Impression / Assessment and Plan / ED Course  I have reviewed the triage vital signs and the nursing notes.  Pertinent labs & imaging results that were available during my care of the patient were reviewed by me and considered in my medical decision making (see chart for details).  Clinical Course as of Sep 22 2020  Mon Sep 22, 2016  1559 Pt reports he is feeling better after medications, no further nausea.  Bandage overlying lumbar surgical incision without noted fluid uptake.    [EW]  1810 Reexamination of abdomen with LLQ tenderness.  Lumbar surgical incision bandage without significant fluid noted.    [EW]  2018 Pt remains asymptomatic.  Plan for CT abd/pelvis.  If negative and bandage over surgical incision remains without significant drainage, pt may be d/c home.  Parents will contact pt's neurosurgeon tomorrow.  [EW]    Clinical Course User Index [EW] Trixie DredgeEmily Amamda Curbow, PA-C  Afebrile nontoxic patient with N/V and dizziness, improved with IVF and antiemetics.  Pt has cerebral palsy and 10 days ago had intrathecal baclofen pump removed.  Family notes clear liquid leaking from this surgical incision.  Pt did have large area of fluid soaking top of his pants upon arrival.  Discussed pt with Dr Clayborne Dana.  CT head ordered with normal ventricle size.  Doubt large CSF leak.  Labs significant for leukocytosis.  Pt remained tachycardic but improving, developed LLQ tenderness of abdomen.  CT abd/pelvis pending at change of shift.  Pt signed out to [employer], PA-C, pending CT abd/pelvis and recheck of lumbar surgical wound.    Final Clinical Impressions(s) / ED Diagnoses   Final diagnoses:  Non-intractable vomiting with nausea, unspecified vomiting type    New Prescriptions New Prescriptions   No medications on file     Trixie Dredge, PA-C 09/22/16 2021    Marily Memos, MD 09/23/16 [phone]

## 2016-09-22 NOTE — Discharge Instructions (Addendum)
Read the information below.  [facility] in the next 1-2 days. You may return to the Emergency Department at any time for worsening condition or any new symptoms that concern you.  If you develop high fevers, worsening abdominal pain, uncontrolled vomiting, changes in mental status/confusion, or are unable to tolerate fluids by mouth, return to the ER for a recheck.

## 2016-09-25 DIAGNOSIS — G039 Meningitis, unspecified: Secondary | ICD-10-CM

## 2016-09-25 HISTORY — DX: Meningitis, unspecified: G03.9

## 2017-09-11 ENCOUNTER — Encounter: Payer: Self-pay | Admitting: Physical Therapy

## 2017-09-11 ENCOUNTER — Ambulatory Visit: Payer: Medicare Other | Attending: Physical Medicine and Rehabilitation | Admitting: Physical Therapy

## 2017-09-11 DIAGNOSIS — G800 Spastic quadriplegic cerebral palsy: Secondary | ICD-10-CM | POA: Insufficient documentation

## 2017-09-11 NOTE — Therapy (Signed)
[facility] [address] [place], [place], [address] Phone: [phone]   Fax:  [phone]  Physical Therapy Evaluation  Patient Details  Name: Miguel HeadingKevin Mira Jr. MRN: [id number] Date of Birth: 04/23/1996 Referring Provider: Dr. Windle GuardKimberly Rauch   Encounter Date: 09/11/2017  PT End of Session - 09/11/17 1240    Visit Number  1    Number of Visits  1    Authorization Type  medicare/medicaid    PT Start Time  1055    PT Stop Time  1143    PT Time Calculation (min)  48 min       Past Medical History:  Diagnosis Date  . Cerebral palsy (HCC)     History reviewed. No pertinent surgical history.  There were no vitals filed for this visit.       [facility] PT Assessment - 09/11/17 1239      Assessment   Medical Diagnosis  Spastic quadriplegia CP    Referring Provider  Dr. Windle GuardKimberly Rauch      Precautions   Precautions  Fall             Objective measurements completed on examination: See above findings.             Wheelchair evaluation was completed at today's visit. Full letter of medical necessity will be available once final quote has been received from vendor - new parts and modifications to be completed  Thank you for the referral of this patient.                 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) - Plan: PT plan of care cert/re-cert  G-Codes - 09/11/17 1245    Functional Assessment Tool Used (Outpatient Only)  uses power wheelchair for mobility    Functional Limitation  Mobility:  Walking and moving around    Mobility: Walking and Moving Around Current Status [phone]([id number])  At least 80 percent but less than 100 percent impaired, limited or restricted    Mobility: Walking and Moving Around Goal Status [phone]([id number])  At least 80 percent but less than 100 percent impaired, limited or restricted    Mobility: Walking and Moving Around Discharge Status [phone]([id number])  At least 80 percent but less than 100 percent impaired, limited or restricted        Problem List Patient Active Problem List   Diagnosis Date Noted  . Cerebral palsy (HCC) 09/15/2012    Jung Ingerson, Donavan BurnetLinda Suzanne, PT 09/11/2017, 12:47 PM  [facility] [address] [place], [place], [address] Phone: [phone]   Fax:  [phone]  Name: Miguel HeadingKevin Craton Jr. MRN: [id number] Date of Birth: 11/12/1995

## 2018-12-10 ENCOUNTER — Other Ambulatory Visit: Payer: Self-pay

## 2018-12-10 ENCOUNTER — Emergency Department (HOSPITAL_COMMUNITY)
Admission: EM | Admit: 2018-12-10 | Discharge: 2018-12-10 | Disposition: A | Discharge status: Home / Self Care | Payer: Medicare Other | Attending: Emergency Medicine | Admitting: Emergency Medicine

## 2018-12-10 ENCOUNTER — Encounter (HOSPITAL_COMMUNITY): Payer: Self-pay | Admitting: Emergency Medicine

## 2018-12-10 DIAGNOSIS — M545 Low back pain: Secondary | ICD-10-CM | POA: Diagnosis present

## 2018-12-10 DIAGNOSIS — G8929 Other chronic pain: Secondary | ICD-10-CM | POA: Diagnosis not present

## 2018-12-10 DIAGNOSIS — Z79899 Other long term (current) drug therapy: Secondary | ICD-10-CM | POA: Diagnosis not present

## 2018-12-10 MED ORDER — BACLOFEN 10 MG PO TABS
10.0000 mg | ORAL_TABLET | Freq: Once | ORAL | Status: AC
  Administered 2018-12-10: 10 mg via ORAL
  Filled 2018-12-10: qty 1

## 2018-12-10 NOTE — Discharge Instructions (Addendum)
Please follow-up with Dr. Carlena Hurl on Monday.   Please pick up your baclofen and take as directed.   For your pain you can also obtain lidocaine patches that can be applied to the painful area of your back over the counter from [place] or other local pharmacies. Please use these as directed.   If you develop numbness, tingling, loss of bowel function or other changes please return to the emergency department.

## 2018-12-10 NOTE — ED Provider Notes (Signed)
[facility] EMERGENCY DEPARTMENT Provider Note   CSN: [id number] Arrival date & time: 12/10/18  1948  History   Chief Complaint Chief Complaint  Patient presents with  . Back Pain    HPI [facility]. is a 23 y.o. male with cerebral palsy and chronic back pain without sciatica presenting with acute back pain for the past two weeks. Seen by PM&R 2/10  physician who provided botox injection and restarted patient's previous baclofen, went to ED 2/12 and was given morphine oral. He has not picked up his baclofen but the injection and morphine have not helped.  He describes the pain as central and shooting up his back. It hurts worse with movement. He had a baclofen pump in 2017 removed and had subsequent meningitis. No recent surgery and denies neck pain or pain with movement. He denies loss of bowels, numbness or tingling. He has chronic constipation and last BM was two days ago. He denies dysuria or changes in urination.   HPI  Past Medical History:  Diagnosis Date  . Cerebral palsy [facility])     Patient Active Problem List   Diagnosis Date Noted  . Cerebral palsy (HCC) 09/15/2012    History reviewed. No pertinent surgical history.      Home Medications    Prior to Admission medications   Medication Sig Start Date End Date Taking? Authorizing Provider  baclofen (LIORESAL) 10 MG tablet Take 10 mg by mouth 3 (three) times daily.    [provider]  cephALEXin (KEFLEX) 500 MG capsule Take 1 capsule (500 mg total) by mouth 4 (four) times daily. Patient not taking: Reported on 09/22/2016 02/09/16   [name], [name], PA-C  ibuprofen (ADVIL,MOTRIN) 800 MG tablet Take 1 tablet (800 mg total) by mouth 3 (three) times daily. Patient not taking: Reported on 09/22/2016 02/09/16   [name], [name], PA-C  ondansetron (ZOFRAN ODT) 4 MG disintegrating tablet Take 1 tablet (4 mg total) by mouth every 8 (eight) hours as needed for nausea or vomiting. Patient not taking:  Reported on 09/22/2016 02/09/16   [name], [name], PA-C  polyethylene glycol powder (MIRALAX) powder Take 0.5 Containers by mouth daily as needed for mild constipation. constipation 05/05/11   [provider]  traZODone (DESYREL) 50 MG tablet Take 50 mg by mouth at bedtime.    [provider]    Family History No family history on file.  Social History Social History   Tobacco Use  . Smoking status: Never Smoker  . Smokeless tobacco: Never Used  Substance Use Topics  . Alcohol use: No  . Drug use: No     Allergies   Patient has no known allergies.   Review of Systems Review of Systems  ROS negative except as noted in HPI   Physical Exam Updated Vital Signs BP (!) 136/58   Pulse (!) 113   Temp 98.3 F (36.8 C) (Oral)   Resp 18   Ht 4\' 10"  (1.473 m)   Wt 45.4 kg   SpO2 97%   BMI 20.90 kg/m   Physical Exam Constitution: mild distress, supine in bed, small stature HENT: AT/[place] Eyes: eom intact, no icterus or injection  Cardio: tachycardic, regular rhythm, no m/r/g  Respiratory: CTA, no wheezing rales or rhonchi  Abdominal: NTTP, non-distended, soft  MSK: TTP midline lumbar and left lumbar paraspinals, chronically contracted UE bilaterally, moving all extremities, no edema  Neuro: a&o, following commands, pleasant, normal affect, difficult speech  Skin: midline lumbar surgical  scar without erythema or warmth    ED Treatments / Results  Labs (all labs ordered are listed, but only abnormal results are displayed) Labs Reviewed - No data to display  EKG None  Radiology No results found.  Procedures Procedures (including critical care time)  Medications Ordered in ED Medications  baclofen (LIORESAL) tablet 10 mg (10 mg Oral Given 12/10/18 2130)     Initial Impression / Assessment and Plan / ED Course  I have reviewed the triage vital signs and the nursing notes.  Pertinent labs & imaging results that were available during my care of the  patient were reviewed by me and considered in my medical decision making (see chart for details).     23yo male presenting with acute on chronic back pain. He sees Dr. Lucy Chris in the outpatient setting for his lower lumbar pain and was last seen 2/10 with xrays done with no acute fracture or findings, botox injection to the legs bilaterally and represcribed baclofen. He is accompanied by his mother and has not yet filled his prescription for baclofen. Presented to the ED at [facility] for his pain. CT abdomen and pelvis were within normal limits. He was given po morphine and discharged. Tonight patient states morphine is not controlling his symptoms. Most recent labs on 2/12 showed no signs of infection. He was TTP in the lower lumbar area which appears likely to be secondary to his chronic back pain and muscle spasms. Minimal pain with movement. No signs of meningitis or other infection. He is stable for discharge. Discussed that his pain will be difficult to control. Restarted patient's baclofen with some mild improvement in symptoms. Recommended he follow-up with Dr. Lucy Chris on Monday as planned, to start baclofen, and recommended OTC lidocaine patches. Discussed plan with patient and mother who understood and agreed. He is stable for discharge at this time, return precautions given.   Final Clinical Impressions(s) / ED Diagnoses   Final diagnoses:  Chronic midline low back pain without sciatica    ED Discharge Orders    None       Germain Koopmann A, DO 12/10/18 2342    Little, Ambrose Finland, MD 12/12/18 1131

## 2018-12-10 NOTE — ED Notes (Signed)
Patient verbalizes understanding of discharge instructions. Opportunity for questioning and answers were provided. Armband removed by staff, pt discharged from ED.  

## 2018-12-10 NOTE — ED Triage Notes (Signed)
BIB EMS from home. Pt reports R lower back pain X2 days. Seen at [facility] 12/08/18 for same, had basic labs and CT with unremarkable results. Pain has continued despite taking Morphine.

## 2019-02-01 DIAGNOSIS — M940 Chondrocostal junction syndrome [Tietze]: Secondary | ICD-10-CM

## 2019-02-01 HISTORY — DX: Chondrocostal junction syndrome (tietze): M94.0

## 2019-06-05 ENCOUNTER — Other Ambulatory Visit: Payer: Self-pay

## 2019-06-05 ENCOUNTER — Emergency Department (HOSPITAL_COMMUNITY)
Admission: EM | Admit: 2019-06-05 | Discharge: 2019-06-05 | Disposition: A | Discharge status: Home / Self Care | Payer: Medicare Other | Attending: Emergency Medicine | Admitting: Emergency Medicine

## 2019-06-05 ENCOUNTER — Encounter (HOSPITAL_COMMUNITY): Payer: Self-pay | Admitting: Emergency Medicine

## 2019-06-05 DIAGNOSIS — R3915 Urgency of urination: Secondary | ICD-10-CM | POA: Diagnosis not present

## 2019-06-05 DIAGNOSIS — R300 Dysuria: Secondary | ICD-10-CM | POA: Diagnosis present

## 2019-06-05 DIAGNOSIS — R1030 Lower abdominal pain, unspecified: Secondary | ICD-10-CM | POA: Insufficient documentation

## 2019-06-05 DIAGNOSIS — Z79899 Other long term (current) drug therapy: Secondary | ICD-10-CM | POA: Diagnosis not present

## 2019-06-05 DIAGNOSIS — G809 Cerebral palsy, unspecified: Secondary | ICD-10-CM | POA: Diagnosis not present

## 2019-06-05 DIAGNOSIS — R102 Pelvic and perineal pain: Secondary | ICD-10-CM

## 2019-06-05 LAB — CBC WITH DIFFERENTIAL/PLATELET
Abs Immature Granulocytes: 0.03 10*3/uL (ref 0.00–0.07)
Basophils Absolute: 0.1 10*3/uL (ref 0.0–0.1)
Basophils Relative: 1 %
Eosinophils Absolute: 0.2 10*3/uL (ref 0.0–0.5)
Eosinophils Relative: 3 %
HCT: 53.1 % — ABNORMAL HIGH (ref 39.0–52.0)
Hemoglobin: 17.7 g/dL — ABNORMAL HIGH (ref 13.0–17.0)
Immature Granulocytes: 0 %
Lymphocytes Relative: 20 %
Lymphs Abs: 1.9 10*3/uL (ref 0.7–4.0)
MCH: 29.1 pg (ref 26.0–34.0)
MCHC: 33.3 g/dL (ref 30.0–36.0)
MCV: 87.2 fL (ref 80.0–100.0)
Monocytes Absolute: 0.7 10*3/uL (ref 0.1–1.0)
Monocytes Relative: 8 %
Neutro Abs: 6.5 10*3/uL (ref 1.7–7.7)
Neutrophils Relative %: 68 %
Platelets: 206 10*3/uL (ref 150–400)
RBC: 6.09 MIL/uL — ABNORMAL HIGH (ref 4.22–5.81)
RDW: 12.8 % (ref 11.5–15.5)
WBC: 9.5 10*3/uL (ref 4.0–10.5)
nRBC: 0 % (ref 0.0–0.2)

## 2019-06-05 LAB — COMPREHENSIVE METABOLIC PANEL
ALT: 25 U/L (ref 0–44)
AST: 28 U/L (ref 15–41)
Albumin: 4.5 g/dL (ref 3.5–5.0)
Alkaline Phosphatase: 94 U/L (ref 38–126)
Anion gap: 13 (ref 5–15)
BUN: 14 mg/dL (ref 6–20)
CO2: 20 mmol/L — ABNORMAL LOW (ref 22–32)
Calcium: 9.5 mg/dL (ref 8.9–10.3)
Chloride: 106 mmol/L (ref 98–111)
Creatinine, Ser: 0.8 mg/dL (ref 0.61–1.24)
GFR calc Af Amer: >60 mL/min (ref >60)
GFR calc non Af Amer: >60 mL/min (ref >60)
Glucose, Bld: 97 mg/dL (ref 70–99)
Potassium: 3.8 mmol/L (ref 3.5–5.1)
Sodium: 139 mmol/L (ref 135–145)
Total Bilirubin: 1.1 mg/dL (ref 0.3–1.2)
Total Protein: 8 g/dL (ref 6.5–8.1)

## 2019-06-05 LAB — URINALYSIS, ROUTINE W REFLEX MICROSCOPIC
Bilirubin Urine: NEGATIVE
Glucose, UA: 50 mg/dL — AB
Hgb urine dipstick: NEGATIVE
Ketones, ur: NEGATIVE mg/dL
Leukocytes,Ua: NEGATIVE
Nitrite: NEGATIVE
Protein, ur: NEGATIVE mg/dL
Specific Gravity, Urine: 1.017 (ref 1.005–1.030)
pH: 7 (ref 5.0–8.0)

## 2019-06-05 MED ORDER — ACETAMINOPHEN 500 MG PO TABS
500.0000 mg | ORAL_TABLET | Freq: Once | ORAL | Status: AC
  Administered 2019-06-05: 500 mg via ORAL
  Filled 2019-06-05: qty 1

## 2019-06-05 NOTE — ED Provider Notes (Addendum)
[place] EMERGENCY DEPARTMENT Provider Note   CSN: [id number] Arrival date & time: 06/05/19  1228    History   Chief Complaint Chief Complaint  Patient presents with  . Bladder spasms  . Dysuria    HPI [facility]. is a 23 y.o. male.     The history is provided by the patient and medical records. No language interpreter was used.  Dysuria Presenting symptoms: dysuria   Associated symptoms: no abdominal pain and no flank pain    [facility]. is a 23 y.o. male  with a PMH of CP who presents to the Emergency Department with mother complaining of pain at the end of his urinary stream for the last week. Patient states that he only time he has pain is at the end of stream. Does feel like a burn and also a tight feeling to the suprapubic region. Sometimes feels as if he needs to urinate more, but cannot. No other abdominal pain. No pain not associated with urination. No nausea or vomiting. No fevers. No medications taken prior to arrival for symptoms. No changes in bowel habits.   Past Medical History:  Diagnosis Date  . Cerebral palsy [facility])     Patient Active Problem List   Diagnosis Date Noted  . Cerebral palsy ([place]) 09/15/2012    No past surgical history on file.      Home Medications    Prior to Admission medications   Medication Sig Start Date End Date Taking? Authorizing Provider  baclofen (LIORESAL) 10 MG tablet Take 10 mg by mouth 3 (three) times daily.    [provider]  cephALEXin (KEFLEX) 500 MG capsule Take 1 capsule (500 mg total) by mouth 4 (four) times daily. Patient not taking: Reported on 09/22/2016 02/09/16   [name], Helane Gunther, PA-C  ibuprofen (ADVIL,MOTRIN) 800 MG tablet Take 1 tablet (800 mg total) by mouth 3 (three) times daily. Patient not taking: Reported on 09/22/2016 02/09/16   [name], [name], PA-C  ondansetron (ZOFRAN ODT) 4 MG disintegrating tablet Take 1 tablet (4 mg total) by mouth every 8 (eight) hours as needed  for nausea or vomiting. Patient not taking: Reported on 09/22/2016 02/09/16   [name], Raquel Sarna C, PA-C  polyethylene glycol powder (MIRALAX) powder Take 0.5 Containers by mouth daily as needed for mild constipation. constipation 05/05/11   [provider]  traZODone (DESYREL) 50 MG tablet Take 50 mg by mouth at bedtime.    [provider]    Family History No family history on file.  Social History Social History   Tobacco Use  . Smoking status: Never Smoker  . Smokeless tobacco: Never Used  Substance Use Topics  . Alcohol use: No  . Drug use: No     Allergies   Patient has no known allergies.   Review of Systems Review of Systems  Gastrointestinal: Negative for abdominal pain.  Genitourinary: Positive for dysuria and urgency. Negative for flank pain.  All other systems reviewed and are negative.    Physical Exam Updated Vital Signs BP 123/83   Pulse (!) 107   Temp 98.4 F (36.9 C) (Oral)   Resp 18   SpO2 97%   Physical Exam Vitals signs and nursing note reviewed.  Constitutional:      General: He is not in acute distress.    Appearance: He is well-developed.     Comments: Non-toxic appearing.  HENT:     Head: Normocephalic and atraumatic.  Neck:  Musculoskeletal: Neck supple.  Cardiovascular:     Rate and Rhythm: Regular rhythm.     Heart sounds: Normal heart sounds. No murmur.  Pulmonary:     Effort: Pulmonary effort is normal. No respiratory distress.     Breath sounds: Normal breath sounds.  Abdominal:     General: There is no distension.     Palpations: Abdomen is soft.     Comments: Mild suprapubic tenderness. No rebound or guarding. No tenderness to McBurney's. No flank or CVA tenderness.  Genitourinary:    Comments: Non-tender. Not impacted, but good bit of stool in vault.  Skin:    General: Skin is warm and dry.  Neurological:     Mental Status: He is alert and oriented to person, place, and time.      ED Treatments /  Results  Labs (all labs ordered are listed, but only abnormal results are displayed) Labs Reviewed  CBC WITH DIFFERENTIAL/PLATELET - Abnormal; Notable for the following components:      Result Value   RBC 6.09 (*)    Hemoglobin 17.7 (*)    HCT 53.1 (*)    All other components within normal limits  COMPREHENSIVE METABOLIC PANEL - Abnormal; Notable for the following components:   CO2 20 (*)    All other components within normal limits  URINALYSIS, ROUTINE W REFLEX MICROSCOPIC - Abnormal; Notable for the following components:   Glucose, UA 50 (*)    All other components within normal limits  URINE CULTURE    EKG None  Radiology No results found.  Procedures Procedures (including critical care time)  Medications Ordered in ED Medications  acetaminophen (TYLENOL) tablet 500 mg (500 mg Oral Given 06/05/19 1404)     Initial Impression / Assessment and Plan / ED Course  I have reviewed the triage vital signs and the nursing notes.  Pertinent labs & imaging results that were available during my care of the patient were reviewed by me and considered in my medical decision making (see chart for details).       [name] is a 23 y.o. male who presents to ED for suprapubic pain only at the end of urinary stream. Denies pain outside of urination. He is not sexually active. He is afebrile, hemodynamically stable with very mild suprapubic tenderness: non-surgical. Labs reassuring with normal white count. UA without signs of infection. Did send for cx. GU exam done without tenderness to suggest prostatitis. Did have moderate amount of stool in vault. Not impacted, but constipation certainly could be contributing to symptoms today. On re-evaluation, patient appears well with benign abdominal exam. Will start on Miralax and have mother call PCP in am to schedule follow up appointment for recheck. I discussed reasons to return to the ER with patient and mother at length. They are in  agreement with plan as dictated above. All questions answered.  Patient discussed with Dr. Anitra LauthPlunkett who agrees with treatment plan.   Final Clinical Impressions(s) / ED Diagnoses   Final diagnoses:  Suprapubic pain  Dysuria    ED Discharge Orders    None          Yohannes Waibel, Chase PicketJaime Pilcher, PA-C 06/05/19 1447    Gwyneth SproutPlunkett, Whitney, MD 06/05/19 2125

## 2019-06-05 NOTE — ED Notes (Signed)
Mother requesting pain meds for son.

## 2019-06-05 NOTE — ED Notes (Signed)
Iv attempted withpout success.

## 2019-06-05 NOTE — ED Triage Notes (Signed)
Pt here for eval of 1 week of pain at end of urination. Pt states he feels like he has bladder spasms.

## 2019-06-05 NOTE — Discharge Instructions (Addendum)
It was my pleasure taking care of you today!  Fortunately, your urine did not look infected today, but I have sent it for a culture.   Take two caps of Miralax today. Stay hydrated.   Call your doctor in the morning to schedule a follow up appointment for recheck.   Return to ER for fever, vomiting, new or worsening symptoms, any additional concerns.

## 2019-06-05 NOTE — ED Notes (Signed)
DC instructions discussed with mother and patient. Understanding verbalized. Home stable with mother.

## 2019-06-06 LAB — URINE CULTURE: Culture: <10000 — AB

## 2019-12-13 ENCOUNTER — Encounter (HOSPITAL_COMMUNITY): Payer: Self-pay | Admitting: Emergency Medicine

## 2019-12-13 ENCOUNTER — Emergency Department (HOSPITAL_COMMUNITY): Payer: Medicare Other

## 2019-12-13 ENCOUNTER — Emergency Department (HOSPITAL_COMMUNITY)
Admission: EM | Admit: 2019-12-13 | Discharge: 2019-12-13 | Disposition: A | Discharge status: Home / Self Care | Payer: Medicare Other | Attending: Emergency Medicine | Admitting: Emergency Medicine

## 2019-12-13 ENCOUNTER — Other Ambulatory Visit: Payer: Self-pay

## 2019-12-13 DIAGNOSIS — K5900 Constipation, unspecified: Secondary | ICD-10-CM | POA: Diagnosis not present

## 2019-12-13 DIAGNOSIS — Z79899 Other long term (current) drug therapy: Secondary | ICD-10-CM | POA: Insufficient documentation

## 2019-12-13 DIAGNOSIS — G809 Cerebral palsy, unspecified: Secondary | ICD-10-CM | POA: Diagnosis not present

## 2019-12-13 DIAGNOSIS — R1030 Lower abdominal pain, unspecified: Secondary | ICD-10-CM | POA: Diagnosis present

## 2019-12-13 LAB — CBC WITH DIFFERENTIAL/PLATELET
Abs Immature Granulocytes: 0.02 10*3/uL (ref 0.00–0.07)
Basophils Absolute: 0 10*3/uL (ref 0.0–0.1)
Basophils Relative: 0 %
Eosinophils Absolute: 0.2 10*3/uL (ref 0.0–0.5)
Eosinophils Relative: 2 %
HCT: 49.1 % (ref 39.0–52.0)
Hemoglobin: 16.2 g/dL (ref 13.0–17.0)
Immature Granulocytes: 0 %
Lymphocytes Relative: 27 %
Lymphs Abs: 2.7 10*3/uL (ref 0.7–4.0)
MCH: 28.9 pg (ref 26.0–34.0)
MCHC: 33 g/dL (ref 30.0–36.0)
MCV: 87.7 fL (ref 80.0–100.0)
Monocytes Absolute: 0.6 10*3/uL (ref 0.1–1.0)
Monocytes Relative: 6 %
Neutro Abs: 6.3 10*3/uL (ref 1.7–7.7)
Neutrophils Relative %: 65 %
Platelets: 215 10*3/uL (ref 150–400)
RBC: 5.6 MIL/uL (ref 4.22–5.81)
RDW: 13 % (ref 11.5–15.5)
WBC: 9.9 10*3/uL (ref 4.0–10.5)
nRBC: 0 % (ref 0.0–0.2)

## 2019-12-13 LAB — URINALYSIS, ROUTINE W REFLEX MICROSCOPIC
Bilirubin Urine: NEGATIVE
Glucose, UA: 50 mg/dL — AB
Hgb urine dipstick: NEGATIVE
Ketones, ur: 5 mg/dL — AB
Leukocytes,Ua: NEGATIVE
Nitrite: NEGATIVE
Protein, ur: NEGATIVE mg/dL
Specific Gravity, Urine: 1.019 (ref 1.005–1.030)
pH: 8 (ref 5.0–8.0)

## 2019-12-13 LAB — COMPREHENSIVE METABOLIC PANEL
ALT: 18 U/L (ref 0–44)
AST: 29 U/L (ref 15–41)
Albumin: 4.3 g/dL (ref 3.5–5.0)
Alkaline Phosphatase: 98 U/L (ref 38–126)
Anion gap: 14 (ref 5–15)
BUN: 13 mg/dL (ref 6–20)
CO2: 24 mmol/L (ref 22–32)
Calcium: 9.6 mg/dL (ref 8.9–10.3)
Chloride: 102 mmol/L (ref 98–111)
Creatinine, Ser: 0.77 mg/dL (ref 0.61–1.24)
GFR calc Af Amer: >60 mL/min (ref >60)
GFR calc non Af Amer: >60 mL/min (ref >60)
Glucose, Bld: 89 mg/dL (ref 70–99)
Potassium: 4 mmol/L (ref 3.5–5.1)
Sodium: 140 mmol/L (ref 135–145)
Total Bilirubin: 1.3 mg/dL — ABNORMAL HIGH (ref 0.3–1.2)
Total Protein: 7.4 g/dL (ref 6.5–8.1)

## 2019-12-13 LAB — LIPASE, BLOOD: Lipase: 31 U/L (ref 11–51)

## 2019-12-13 LAB — POC OCCULT BLOOD, ED: Fecal Occult Bld: POSITIVE — AB

## 2019-12-13 MED ORDER — ACETAMINOPHEN 325 MG PO TABS
325.0000 mg | ORAL_TABLET | Freq: Once | ORAL | Status: AC
  Administered 2019-12-13: 325 mg via ORAL
  Filled 2019-12-13: qty 1

## 2019-12-13 MED ORDER — DOCUSATE SODIUM 100 MG PO CAPS: 100.0000 mg | ORAL_CAPSULE | Freq: Two times a day (BID) | ORAL | 0 refills | Status: DC

## 2019-12-13 MED ORDER — IOHEXOL 300 MG/ML  SOLN
100.0000 mL | Freq: Once | INTRAMUSCULAR | Status: AC | PRN
  Administered 2019-12-13: 100 mL via INTRAVENOUS

## 2019-12-13 NOTE — ED Triage Notes (Signed)
Patient arrives with mother c/o lower back/ rectum pain x 3 weeks. Patient having difficulty describing exact location of pain but reports it is worse after having a BM and worse when laying flat. Patient has cerebral palsy and in wheelchair during day. Mother states when wiping patient he may have a boil at his rectum.

## 2019-12-13 NOTE — ED Notes (Signed)
Patient transported to CT 

## 2019-12-13 NOTE — Discharge Instructions (Signed)
We recommend that you take Colace as prescribed in addition to MiraLAX twice a day.  You may continue to use over-the-counter home enemas to promote movement of stool.  Take Tylenol or ibuprofen for management of any persistent discomfort.  Return to the emergency department if symptoms persist or worsen.

## 2019-12-13 NOTE — ED Provider Notes (Signed)
[facility] EMERGENCY DEPARTMENT Provider Note   CSN: [id number] Arrival date & time: 12/13/19  1641     History No chief complaint on file.   M[facility]. is a 24 y.o. 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  Patient presents today for lower abdominal pain, pelvic pain and lower back pain onset approximately 3 weeks ago.  Described as a aching throb constant radiating all around his lower abdomen and lower back, no clear alleviating factors, worsened with bowel movements and currently severe in intensity.  He reports history of similar when he was a child and was diagnosed with "intestinal problem requiring surgery".  Of note patient was seen by his physical medicine specialist today and had botulism injection for contractures, reports pain unrelated today. HPI     Past Medical History:  Diagnosis Date  . Cerebral palsy [facility])     Patient Active Problem List   Diagnosis Date Noted  . Cerebral palsy (HCC) 09/15/2012    History reviewed. No pertinent surgical history.     No family history on file.  Social History   Tobacco Use  . Smoking status: Never Smoker  . Smokeless tobacco: Never Used  Substance Use Topics  . Alcohol use: No  . Drug use: No    Home Medications Prior to Admission medications   Medication Sig Start Date End Date Taking? Authorizing Provider  baclofen (LIORESAL) 10 MG tablet Take 10 mg by mouth 3 (three) times daily.    [provider]  cephALEXin (KEFLEX) 500 MG capsule Take 1 capsule (500 mg total) by mouth 4 (four) times daily. Patient not taking: Reported on 09/22/2016 02/09/16   [name], [name], PA-C  ibuprofen (ADVIL,MOTRIN) 800 MG tablet Take 1 tablet (800 mg total) by mouth 3 (three) times daily. Patient not taking: Reported on 09/22/2016 02/09/16   [name], [name], PA-C  ondansetron (ZOFRAN ODT) 4 MG disintegrating tablet Take 1 tablet (4 mg total) by mouth every 8 (eight) hours as needed for  nausea or vomiting. Patient not taking: Reported on 09/22/2016 02/09/16   [name], [name], PA-C  polyethylene glycol powder (MIRALAX) powder Take 0.5 Containers by mouth daily as needed for mild constipation. constipation 05/05/11   [provider]  traZODone (DESYREL) 50 MG tablet Take 50 mg by mouth at bedtime.    [provider]    Allergies    Patient has no known allergies.  Review of Systems   Review of Systems Ten systems are reviewed and are negative for acute change except as noted in the HPI  Physical Exam Updated Vital Signs BP 115/68   Pulse 85   Temp 98.1 F (36.7 C) (Oral)   Resp 16   SpO2 100%   Physical Exam Constitutional:      General: He is awake. He is not in acute distress.    Appearance: He is not ill-appearing or toxic-appearing.  HENT:     Head: Normocephalic and atraumatic.     Jaw: There is normal jaw occlusion.     Right Ear: External ear normal.     Left Ear: External ear normal.     Nose: Nose normal.     Mouth/Throat:     Mouth: Mucous membranes are moist.     Pharynx: Oropharynx is clear.  Eyes:     General: Vision grossly intact. Gaze aligned appropriately.     Extraocular Movements: Extraocular movements intact.  Neck:     Trachea: Trachea and phonation  normal. No tracheal deviation.  Cardiovascular:     Rate and Rhythm: Normal rate and regular rhythm.     Heart sounds: Normal heart sounds.  Pulmonary:     Effort: Pulmonary effort is normal. No accessory muscle usage or respiratory distress.     Breath sounds: Normal breath sounds and air entry.  Chest:     Chest wall: No tenderness.  Abdominal:     Palpations: Abdomen is soft.     Tenderness: There is abdominal tenderness in the right lower quadrant, suprapubic area and left lower quadrant. There is no guarding or rebound.  Genitourinary:    Comments: Rectal examination chaperoned by [facility] RN.  No external hemorrhoids or abnormalities, normal rectal tone, no pain with  internal exam, stool light brown, no palpable abnormalities internally. Musculoskeletal:     Cervical back: Normal range of motion and neck supple.     Comments: Contractures consistent with cerebral palsy, baseline per patient and his mother  Skin:    General: Skin is warm and dry.  Neurological:     Mental Status: He is alert and oriented to person, place, and time.  Psychiatric:        Behavior: Behavior is cooperative.     ED Results / Procedures / Treatments   Labs (all labs ordered are listed, but only abnormal results are displayed) Labs Reviewed - No data to display  EKG None  Radiology No results found.  Procedures Procedures (including critical care time)  Medications Ordered in ED Medications - No data to display  ED Course  I have reviewed the triage vital signs and the nursing notes.  Pertinent labs & imaging results that were available during my care of the patient were reviewed by me and considered in my medical decision making (see chart for details).    MDM Rules/Calculators/A&P                     Chart review from procedural visit with physical medicine today, shows patient with diagnosis of coccydynia and he was recommended a trial of Voltaren, currently considering referral for ganglion impar injection if not improved.  He received botulism injections into right medial hamstring, left medial hamstring and left lateral hamstring. 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  Pain increased with bowel movements.  Rectal examination today is unremarkable, normal rectal tone, no visible or palpable hemorrhoids and light brown stool.  No pain on rectal exam suggestive of proctitis.  Will obtain basic abdominal lab work as well as CT abdomen pelvis, patient is mother concerned for possible "intestinal twisting".  As patient has had urgent surgery as a  child for intestinal problems due to his condition. - Hemoccult positive CBC within normal limits no leukocytosis to suggest infection, no anemia Lipase within normal limits CMP nonacute no significant electrolyte abnormality or kidney injury Urinalysis pending CT abdomen pelvis pending - Patient reassessed resting comfortably no acute distress.  Care handoff given to [facility] PA-C at shift change.  Plan of care is to follow-up on CT imaging and urinalysis.  Pending negative anticipate discharge with GI follow-up regarding positive Hemoccult.  Disposition per oncoming team.  Note: Portions of this report may have been transcribed using voice recognition software. Every effort was made to ensure accuracy; however, inadvertent computerized transcription errors may still be present. Final Clinical Impression(s) / ED Diagnoses Final diagnoses:  None    Rx / DC Orders ED Discharge Orders    None       Elizabeth Palau 12/13/19 2211    Melene Plan, DO 12/13/19 2219

## 2019-12-13 NOTE — ED Provider Notes (Signed)
10:40 PM Patient care assumed at change of shift from [place], [place].  Patient with history of spastic quadriplegia and cerebral palsy presenting for lower abdominal and pelvic pain x 3 weeks.  Pending CT scan at shift change as well as urinalysis.  Remaining work-up has been reviewed.  Urinalysis negative for UTI.  CT shows rectal distention secondary to stool burden.  Imaging questions presence of fecal impaction, though this was not noted by prior provider on DRE.  The patient does have a longstanding history of constipation, per mother.  I have offered an enema in the emergency department tonight to help improve symptoms, but mother is keen to continue care at home.  States that she has used home enemas in the past.  The patient reports interval improvement in his discomfort with Tylenol.  Will discharge with Colace. Also advised Miralax be increased from QD to BID. Patient to follow up with his primary care doctor. Return precautions discussed and provided. Patient discharged in stable condition. Patient and mother with no unaddressed concerns.   Results for orders placed or performed during the hospital encounter of 12/13/19  CBC with Differential  Result Value Ref Range   WBC 9.9 4.0 - 10.5 K/uL   RBC 5.60 4.22 - 5.81 MIL/uL   Hemoglobin 16.2 13.0 - 17.0 g/dL   HCT 72.0 94.7 - 09.6 %   MCV 87.7 80.0 - 100.0 fL   MCH 28.9 26.0 - 34.0 pg   MCHC 33.0 30.0 - 36.0 g/dL   RDW 28.3 66.2 - 94.7 %   Platelets 215 150 - 400 K/uL   nRBC 0.0 0.0 - 0.2 %   Neutrophils Relative % 65 %   Neutro Abs 6.3 1.7 - 7.7 K/uL   Lymphocytes Relative 27 %   Lymphs Abs 2.7 0.7 - 4.0 K/uL   Monocytes Relative 6 %   Monocytes Absolute 0.6 0.1 - 1.0 K/uL   Eosinophils Relative 2 %   Eosinophils Absolute 0.2 0.0 - 0.5 K/uL   Basophils Relative 0 %   Basophils Absolute 0.0 0.0 - 0.1 K/uL   Immature Granulocytes 0 %   Abs Immature Granulocytes 0.02 0.00 - 0.07 K/uL  Comprehensive metabolic panel  Result Value  Ref Range   Sodium 140 135 - 145 mmol/L   Potassium 4.0 3.5 - 5.1 mmol/L   Chloride 102 98 - 111 mmol/L   CO2 24 22 - 32 mmol/L   Glucose, Bld 89 70 - 99 mg/dL   BUN 13 6 - 20 mg/dL   Creatinine, Ser 6.54 0.61 - 1.24 mg/dL   Calcium 9.6 8.9 - 65.0 mg/dL   Total Protein 7.4 6.5 - 8.1 g/dL   Albumin 4.3 3.5 - 5.0 g/dL   AST 29 15 - 41 U/L   ALT 18 0 - 44 U/L   Alkaline Phosphatase 98 38 - 126 U/L   Total Bilirubin 1.3 (H) 0.3 - 1.2 mg/dL   GFR calc non Af Amer >60 >60 mL/min   GFR calc Af Amer >60 >60 mL/min   Anion gap 14 5 - 15  Lipase, blood  Result Value Ref Range   Lipase 31 11 - 51 U/L  Urinalysis, Routine w reflex microscopic  Result Value Ref Range   Color, Urine YELLOW YELLOW   APPearance HAZY (A) CLEAR   Specific Gravity, Urine 1.019 1.005 - 1.030   pH 8.0 5.0 - 8.0   Glucose, UA 50 (A) NEGATIVE mg/dL   Hgb urine dipstick NEGATIVE NEGATIVE  Bilirubin Urine NEGATIVE NEGATIVE   Ketones, ur 5 (A) NEGATIVE mg/dL   Protein, ur NEGATIVE NEGATIVE mg/dL   Nitrite NEGATIVE NEGATIVE   Leukocytes,Ua NEGATIVE NEGATIVE  POC occult blood, ED  Result Value Ref Range   Fecal Occult Bld POSITIVE (A) NEGATIVE   CT ABDOMEN PELVIS W CONTRAST  Result Date: 12/13/2019 CLINICAL DATA:  Unspecified lower abdominal and pelvic pain. Hemoccult positive. Cerebral palsy patient. EXAM: CT ABDOMEN AND PELVIS WITH CONTRAST TECHNIQUE: Multidetector CT imaging of the abdomen and pelvis was performed using the standard protocol following bolus administration of intravenous contrast. CONTRAST:  159mL OMNIPAQUE IOHEXOL 300 MG/ML  SOLN COMPARISON:  CT 09/22/2016 FINDINGS: Lower chest: Minor subsegmental atelectasis in both lower lobes. No pleural fluid. Hepatobiliary: No focal hepatic abnormality. The gallbladder is decompressed. No calcified gallstone. Pancreas: No ductal dilatation or inflammation. Spleen: Normal in size without focal abnormality. Adrenals/Urinary Tract: Normal adrenal glands. No  hydronephrosis or perinephric edema. Homogeneous renal enhancement with symmetric excretion. Urinary bladder is partially distended without wall thickening. Bladder is displaced anteriorly by stool distended rectum. Stomach/Bowel: Bowel evaluation is limited in the absence of enteric contrast and paucity of intra-abdominal fat. The stomach is nondistended. Postsurgical change in the duodenum with chain sutures. Duodenum again noted to be dilated and fluid-filled. Transition in the third portion, is proximal to the SMA. 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 Few fluid-filled nondilated small bowel loops in the left abdomen. The appendix is not clearly visualized. No pericecal or right lower quadrant inflammation to suggest appendicitis. Small volume of stool in the ascending colon, transverse and descending colon are decompressed. There is stool distending the rectum with rectal distention of 6.4 cm. No colonic or obvious perirectal inflammation. Well-defined soft tissue density in the left inferior perineum felt to represent left testicle in the hemiscrotum displaced posteriorly. No evidence of perirectal fluid collection. Vascular/Lymphatic: Normal caliber abdominal aorta. Portal vein is patent. No portal venous or mesenteric gas. No bulky abdominopelvic adenopathy. Reproductive: Right inguinal testis again suspected. Posterior displacement of the left testis. Prostate is unremarkable. Other: No free air or ascites. No evidence of intra-abdominal fluid collection. Scarring of the left anterior abdominal wall. Musculoskeletal: There are no acute or suspicious osseous abnormalities. IMPRESSION: 1. Stool distended rectum with rectal distension, possible fecal impaction. Small volume of stool throughout the remainder the colon. No bowel inflammation. 2. 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 This is unchanged from prior. The stomach is not distended on the current exam. 3. 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 Electronically Signed   By: Keith Rake M.D.   On: 12/13/2019 22:24     Antonietta Breach, PA-C 12/13/19 2259    Noemi Chapel, MD 12/16/19 [phone]

## 2019-12-13 NOTE — ED Notes (Signed)
IV attempted, vein blew. Consult for IV team has been ordered.

## 2019-12-19 ENCOUNTER — Ambulatory Visit: Payer: Medicare Other | Attending: Internal Medicine

## 2019-12-19 DIAGNOSIS — Z23 Encounter for immunization: Secondary | ICD-10-CM | POA: Insufficient documentation

## 2019-12-19 NOTE — Progress Notes (Signed)
   [id number]  Name:  Jacorian Golaszewski.    MRN: [id number] DOB: 04/30/1996  12/19/2019  Mr. Dever was observed post Covid-19 immunization for 15 minutes without incidence. He was provided with Vaccine Information Sheet and instruction to access the V-Safe system.   Mr. Pellot was instructed to call 911 with any severe reactions post vaccine: Marland Kitchen Difficulty breathing  . Swelling of your face and throat  . A fast heartbeat  . A bad rash all over your body  . Dizziness and weakness    Immunizations Administered    Name Date Dose VIS Date Route   Moderna COVID-19 Vaccine 12/19/2019 12:56 PM 0.5 mL 09/27/2019 Intramuscular   Manufacturer: Moderna   Lot: [id number]   NDC: 73419-379-02

## 2020-01-17 ENCOUNTER — Ambulatory Visit: Payer: Medicare Other | Attending: Internal Medicine

## 2020-01-17 DIAGNOSIS — Z23 Encounter for immunization: Secondary | ICD-10-CM

## 2020-01-17 NOTE — Progress Notes (Signed)
   [id number]  Name:  Miguel Myers.    MRN: [id number] DOB: 1995/10/31  01/17/2020  Miguel Myers was observed post Covid-19 immunization for 15 minutes without incident. He was provided with Vaccine Information Sheet and instruction to access the V-Safe system.   Miguel Myers was instructed to call 911 with any severe reactions post vaccine: Marland Kitchen Difficulty breathing  . Swelling of face and throat  . A fast heartbeat  . A bad rash all over body  . Dizziness and weakness   Immunizations Administered    Name Date Dose VIS Date Route   Moderna COVID-19 Vaccine 01/17/2020 10:33 AM 0.5 mL 09/27/2019 Intramuscular   Manufacturer: Gala Murdoch   Lot: [id number]   NDC: 12197-588-32

## 2020-04-17 DIAGNOSIS — K5641 Fecal impaction: Secondary | ICD-10-CM

## 2020-04-17 HISTORY — DX: Fecal impaction: K56.41

## 2021-01-25 DIAGNOSIS — M546 Pain in thoracic spine: Secondary | ICD-10-CM

## 2021-01-25 HISTORY — DX: Pain in thoracic spine: M54.6

## 2021-03-07 ENCOUNTER — Encounter: Payer: Self-pay | Admitting: Emergency Medicine

## 2021-03-07 ENCOUNTER — Emergency Department: Payer: Medicare Other

## 2021-03-07 ENCOUNTER — Emergency Department
Admission: EM | Admit: 2021-03-07 | Discharge: 2021-03-07 | Disposition: A | Discharge status: Home / Self Care | Payer: Medicare Other | Attending: Emergency Medicine | Admitting: Emergency Medicine

## 2021-03-07 ENCOUNTER — Other Ambulatory Visit: Payer: Self-pay

## 2021-03-07 DIAGNOSIS — R000 Tachycardia, unspecified: Secondary | ICD-10-CM | POA: Insufficient documentation

## 2021-03-07 DIAGNOSIS — R197 Diarrhea, unspecified: Secondary | ICD-10-CM | POA: Diagnosis not present

## 2021-03-07 DIAGNOSIS — R112 Nausea with vomiting, unspecified: Secondary | ICD-10-CM | POA: Diagnosis present

## 2021-03-07 DIAGNOSIS — Z20822 Contact with and (suspected) exposure to covid-19: Secondary | ICD-10-CM | POA: Diagnosis not present

## 2021-03-07 DIAGNOSIS — R1084 Generalized abdominal pain: Secondary | ICD-10-CM | POA: Diagnosis not present

## 2021-03-07 LAB — COMPREHENSIVE METABOLIC PANEL
ALT: 33 U/L (ref 0–44)
AST: 45 U/L — ABNORMAL HIGH (ref 15–41)
Albumin: 4.6 g/dL (ref 3.5–5.0)
Alkaline Phosphatase: 127 U/L — ABNORMAL HIGH (ref 38–126)
Anion gap: 15 (ref 5–15)
BUN: 16 mg/dL (ref 6–20)
CO2: 19 mmol/L — ABNORMAL LOW (ref 22–32)
Calcium: 9.7 mg/dL (ref 8.9–10.3)
Chloride: 103 mmol/L (ref 98–111)
Creatinine, Ser: 0.85 mg/dL (ref 0.61–1.24)
GFR, Estimated: >60 mL/min (ref >60)
Glucose, Bld: 128 mg/dL — ABNORMAL HIGH (ref 70–99)
Potassium: 4.2 mmol/L (ref 3.5–5.1)
Sodium: 137 mmol/L (ref 135–145)
Total Bilirubin: 2 mg/dL — ABNORMAL HIGH (ref 0.3–1.2)
Total Protein: 8.5 g/dL — ABNORMAL HIGH (ref 6.5–8.1)

## 2021-03-07 LAB — RESP PANEL BY RT-PCR (FLU A&B, COVID) ARPGX2
Influenza A by PCR: NEGATIVE
Influenza B by PCR: NEGATIVE
SARS Coronavirus 2 by RT PCR: NEGATIVE

## 2021-03-07 LAB — CBC WITH DIFFERENTIAL/PLATELET
Abs Immature Granulocytes: 0.06 10*3/uL (ref 0.00–0.07)
Basophils Absolute: 0 10*3/uL (ref 0.0–0.1)
Basophils Relative: 0 %
Eosinophils Absolute: 0.2 10*3/uL (ref 0.0–0.5)
Eosinophils Relative: 1 %
HCT: 49.1 % (ref 39.0–52.0)
Hemoglobin: 16.5 g/dL (ref 13.0–17.0)
Immature Granulocytes: 1 %
Lymphocytes Relative: 20 %
Lymphs Abs: 2.4 10*3/uL (ref 0.7–4.0)
MCH: 29.5 pg (ref 26.0–34.0)
MCHC: 33.6 g/dL (ref 30.0–36.0)
MCV: 87.7 fL (ref 80.0–100.0)
Monocytes Absolute: 0.7 10*3/uL (ref 0.1–1.0)
Monocytes Relative: 6 %
Neutro Abs: 9.1 10*3/uL — ABNORMAL HIGH (ref 1.7–7.7)
Neutrophils Relative %: 72 %
Platelets: 236 10*3/uL (ref 150–400)
RBC: 5.6 MIL/uL (ref 4.22–5.81)
RDW: 13.4 % (ref 11.5–15.5)
WBC: 12.5 10*3/uL — ABNORMAL HIGH (ref 4.0–10.5)
nRBC: 0 % (ref 0.0–0.2)

## 2021-03-07 LAB — URINALYSIS, COMPLETE (UACMP) WITH MICROSCOPIC
Bacteria, UA: NONE SEEN
Bilirubin Urine: NEGATIVE
Glucose, UA: 50 mg/dL — AB
Hgb urine dipstick: NEGATIVE
Ketones, ur: 20 mg/dL — AB
Leukocytes,Ua: NEGATIVE
Nitrite: NEGATIVE
Protein, ur: NEGATIVE mg/dL
Specific Gravity, Urine: 1.016 (ref 1.005–1.030)
Squamous Epithelial / HPF: NONE SEEN (ref 0–5)
pH: 5 (ref 5.0–8.0)

## 2021-03-07 LAB — LIPASE, BLOOD: Lipase: 28 U/L (ref 11–51)

## 2021-03-07 MED ORDER — ONDANSETRON 4 MG PO TBDP: 4.0000 mg | ORAL_TABLET | Freq: Three times a day (TID) | ORAL | 0 refills | Status: DC | PRN

## 2021-03-07 MED ORDER — MORPHINE SULFATE (PF) 2 MG/ML IV SOLN
2.0000 mg | Freq: Once | INTRAVENOUS | Status: AC
  Administered 2021-03-07: 2 mg via INTRAVENOUS
  Filled 2021-03-07: qty 1

## 2021-03-07 MED ORDER — IOHEXOL 9 MG/ML PO SOLN
500.0000 mL | ORAL | Status: AC
  Administered 2021-03-07 (×2): 500 mL via ORAL

## 2021-03-07 MED ORDER — ONDANSETRON HCL 4 MG/2ML IJ SOLN
4.0000 mg | Freq: Once | INTRAMUSCULAR | Status: AC
  Administered 2021-03-07: 4 mg via INTRAVENOUS
  Filled 2021-03-07: qty 2

## 2021-03-07 MED ORDER — LACTATED RINGERS IV BOLUS
1000.0000 mL | Freq: Once | INTRAVENOUS | Status: AC
  Administered 2021-03-07: 1000 mL via INTRAVENOUS

## 2021-03-07 MED ORDER — MORPHINE SULFATE (PF) 2 MG/ML IV SOLN
2.0000 mg | Freq: Once | INTRAVENOUS | Status: AC
Start: 2021-03-07 — End: 2021-03-07
  Administered 2021-03-07: 2 mg via INTRAVENOUS
  Filled 2021-03-07: qty 1

## 2021-03-07 MED ORDER — HYDROMORPHONE HCL 1 MG/ML IJ SOLN
0.5000 mg | Freq: Once | INTRAMUSCULAR | Status: AC
Start: 2021-03-07 — End: 2021-03-07
  Administered 2021-03-07: 0.5 mg via INTRAVENOUS
  Filled 2021-03-07: qty 1

## 2021-03-07 MED ORDER — SODIUM CHLORIDE 0.9 % IV BOLUS
1000.0000 mL | Freq: Once | INTRAVENOUS | Status: AC
  Administered 2021-03-07: 1000 mL via INTRAVENOUS

## 2021-03-07 MED ORDER — FAMOTIDINE IN NACL 20-0.9 MG/50ML-% IV SOLN
20.0000 mg | Freq: Once | INTRAVENOUS | Status: AC
  Administered 2021-03-07: 20 mg via INTRAVENOUS
  Filled 2021-03-07: qty 50

## 2021-03-07 NOTE — ED Notes (Signed)
Father of pt states tha tpt tolerated PO challenge and they would like to discharge home. EDP informed. Discharge planning underway.

## 2021-03-07 NOTE — ED Notes (Signed)
Pharmacy updated , Meds sent to [facility] off [address] .

## 2021-03-07 NOTE — ED Provider Notes (Signed)
Patient received in signout from Dr. Beather Arbour pending IV fluids, reassessment and stool studies.  Patient has tolerated p.o. intake, reports feeling better and tachycardia has resolved.  Urinalysis with mild ketonuria further suggestive of dehydration associated with gastroenteritis.  Patient has not had any stools and father is requesting discharge without stool studies as he indicates his son is looking better and is comfortable taking the patient home.  Discussed return precautions for the ED and patient stable for outpatient management.  Clinical Course as of 03/07/21 1233  Thu Mar 07, 2021  0503 Updated patient and father on CBC, met B and chest x-ray results.  Patient complains of persistent pain and nausea.  Will administer additional IV analgesia and antiemetic.  Currently working on oral contrast for CT scan.  We will continue to monitor. [JS]  [phone] Updated patient and father on CT imaging result.  Patient is more comfortable and resting.  Remains tachycardic.  Will administer additional IV fluids.  Awaiting urine specimen, stool studies and reassessment.  Patient was able to tolerate oral contrast without emesis.  Care will be transferred to the oncoming provider at change of shift. [JS]  [id number] Reassessed.  Father reports that patient seems little bit better but is concerned that patient is yet to provide a urine sample.  Patient remains tachycardic, although improved, to the low 100s.  We discussed additional bag of IV fluids and reassessment.  They are agreeable. [DS]    Clinical Course User Index [DS] Vladimir Crofts, MD [JS] Paulette Blanch, MD       Vladimir Crofts, MD 03/07/21 [phone]

## 2021-03-07 NOTE — ED Notes (Signed)
ED Provider at bedside. 

## 2021-03-07 NOTE — ED Notes (Signed)
Patient transported to CT 

## 2021-03-07 NOTE — ED Notes (Signed)
Discharge instructions reviewed with pt father. Pt calm , collective.

## 2021-03-07 NOTE — ED Provider Notes (Signed)
[facility] Emergency Department Provider Note   ____________________________________________   Event Date/Time   First MD Initiated Contact with Patient 03/07/21 [phone]     (approximate)  I have reviewed the triage vital signs and the nursing notes.   HISTORY  Chief Complaint Emesis and Diarrhea  Level of V caveat: Verbal communication limited by cerebral palsy; history obtained by patient's father  HPI Miguel Myers. is a 25 y.o. male brought to the ED from home by his father with a chief complaint of abdominal pain, nausea/vomiting/diarrhea.  Patient returned from [place] 2 days ago.  Developed onset of diarrhea yesterday, approximately 5 episodes.  Awoke 2 hours ago with nausea and vomiting, 2 episodes.  Complains of upper abdominal pain.  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  Father states stools and emesis have black specks in them.  Denies fever, cough, chest pain, shortness of breath, dysuria.  Patient is vaccinated and boosted against COVID-19.     Past Medical History:  Diagnosis Date  . Cerebral palsy ([place])   Meningitis Prematurity  Patient Active Problem List   Diagnosis Date Noted  . Cerebral palsy ([place]) 09/15/2012   Past Surgical History Procedure Laterality Date  . HERNIA REPAIR  . HIP ADDUCTOR TENOTOMY  . PR INSERT SPINE INFUSN DEVICE,SUBCUT Left 11/30/2013  Procedure: IMPLNT/REPLAC DEVIC-EPIDUR DRUG INFUS; SUBQ RESV; Myers: Marylouise Stacks, MD; Location[facility]; Service: Neurosurgery  . PR INSERT/ REPLACE INFUSN PUMP,PROGRAMMABLE Left 11/30/2013  Procedure: IMPLNT/REPLAC DEVIC-EPIDUR INFUS; PROGRMBLE PUMP; Myers: Marylouise Stacks, MD; Location[facility]; Service: Neurosurgery  . PR REMOVE INFUSN DEVICE/PUMP N/A 09/12/2016  Procedure: REMOV PREV IMPLNT SUBQ RESERVOIR/PUMP; Myers: Suzi Roots, MD; Location: [place]; Service:  Neurosurgery  . SMALL INTESTINE SURGERY     Prior to Admission medications   Medication Sig Start Date End Date Taking? Authorizing Provider  baclofen (LIORESAL) 10 MG tablet Take 10 mg by mouth 3 (three) times daily.    [provider]  cephALEXin (KEFLEX) 500 MG capsule Take 1 capsule (500 mg total) by mouth 4 (four) times daily. Patient not taking: Reported on 09/22/2016 02/09/16   Arlean Hopping C, PA-C  docusate sodium (COLACE) 100 MG capsule Take 1 capsule (100 mg total) by mouth every 12 (twelve) hours. 12/13/19   Antonietta Breach, PA-C  ibuprofen (ADVIL,MOTRIN) 800 MG tablet Take 1 tablet (800 mg total) by mouth 3 (three) times daily. Patient not taking: Reported on 09/22/2016 02/09/16   [name], [name], PA-C  ondansetron (ZOFRAN ODT) 4 MG disintegrating tablet Take 1 tablet (4 mg total) by mouth every 8 (eight) hours as needed for nausea or vomiting. Patient not taking: Reported on 09/22/2016 02/09/16   [name], Raquel Sarna C, PA-C  polyethylene glycol powder (MIRALAX) powder Take 0.5 Containers by mouth daily as needed for mild constipation. constipation 05/05/11   [provider]  traZODone (DESYREL) 50 MG tablet Take 50 mg by mouth at bedtime.    [provider]    Allergies Patient has no known allergies.  No family history on file.  Social History Social History   Tobacco Use  . Smoking status: Never Smoker  . Smokeless tobacco: Never Used  Substance Use Topics  . Alcohol use: No  . Drug use: No    Review of Systems  Constitutional: No fever/chills Eyes: No visual changes. ENT: No sore throat. Cardiovascular: Denies chest pain. Respiratory: Denies shortness  of breath. Gastrointestinal: Positive for abdominal pain, nausea, vomiting and diarrhea.  No constipation. Genitourinary: Negative for dysuria. Musculoskeletal: Negative for back pain. Skin: Negative for rash. Neurological: Negative for headaches, focal weakness or  numbness.   ____________________________________________   PHYSICAL EXAM:  VITAL SIGNS: ED Triage Vitals  Enc Vitals Group     BP 03/07/21 0347 112/86     Pulse Rate 03/07/21 0347 (!) 112     Resp 03/07/21 0347 20     Temp 03/07/21 0347 (!) 97.5 F (36.4 C)     Temp Source 03/07/21 0347 Oral     SpO2 03/07/21 0347 100 %     Weight 03/07/21 0342 135 lb (61.2 kg)     Height 03/07/21 0342 _0  (1.473 m)     Head Circumference --      Peak Flow --      Pain Score 03/07/21 0342 9     Pain Loc --      Pain Edu? --      Excl. in [place]? --     Constitutional: Alert and oriented.  Uncomfortable appearing and in mild acute distress. Eyes: Conjunctivae are normal. PERRL. EOMI. Head: Atraumatic. Nose: No congestion/rhinnorhea. Mouth/Throat: Mucous membranes are mildly dry. Neck: No stridor.   Cardiovascular: Tachycardic rate, regular rhythm. Grossly normal heart sounds.  Good peripheral circulation. Respiratory: Normal respiratory effort.  No retractions. Lungs CTAB. Gastrointestinal: Soft with mild diffuse tenderness to palpation without rebound or guarding. No distention. No abdominal bruits. No CVA tenderness. Musculoskeletal: No lower extremity tenderness nor edema.  No joint effusions. Neurologic: Limited speech and language at baseline.  All extremities contractured at baseline. Skin:  Skin is warm, dry and intact. No rash noted. Psychiatric: Mood and affect are normal. Speech and behavior are normal.  ____________________________________________   LABS (all labs ordered are listed, but only abnormal results are displayed)  Labs Reviewed  CBC WITH DIFFERENTIAL/PLATELET - Abnormal; Notable for the following components:      Result Value   WBC 12.5 (*)    Neutro Abs 9.1 (*)    All other components within normal limits  COMPREHENSIVE METABOLIC PANEL - Abnormal; Notable for the following components:   CO2 19 (*)    Glucose, Bld 128 (*)    Total Protein 8.5 (*)    AST 45  (*)    Alkaline Phosphatase 127 (*)    Total Bilirubin 2.0 (*)    All other components within normal limits  RESP PANEL BY RT-PCR (FLU A&B, COVID) ARPGX2  C DIFFICILE QUICK SCREEN W PCR REFLEX  GASTROINTESTINAL PANEL BY PCR, STOOL (REPLACES STOOL CULTURE)  LIPASE, BLOOD  URINALYSIS, COMPLETE (UACMP) WITH MICROSCOPIC   ____________________________________________  EKG  ED ECG REPORT I, Nery Kalisz J, the attending physician, personally viewed and interpreted this ECG.   Date: 03/07/2021  EKG Time: 0355  Rate: 120  Rhythm: sinus tachycardia  Axis: Normal  Intervals:none  ST&T Change: Nonspecific  ____________________________________________  RADIOLOGY I, Hannan Hutmacher J, personally viewed and evaluated these images (plain radiographs) as part of my medical decision making, as well as reviewing the written report by the radiologist.  ED MD interpretation: No acute cardiopulmonary process; CT scan demonstrates acute gastroenteritis/ileus, otherwise unremarkable  Official radiology report(s): CT Abdomen Pelvis Wo Contrast  Result Date: 03/07/2021 CLINICAL DATA:  Nausea and vomiting with abdominal pain EXAM: CT ABDOMEN AND PELVIS WITHOUT CONTRAST TECHNIQUE: Multidetector CT imaging of the abdomen and pelvis was performed following the standard protocol without IV contrast. COMPARISON:  12/13/2019 FINDINGS: Lower chest: Mild dependent atelectasis or scarring. Contrast distends the lower esophagus, likely related to history of vomiting. Hepatobiliary: No focal liver abnormality.No evidence of biliary obstruction or stone. Pancreas: Unremarkable. Spleen: Unremarkable. Adrenals/Urinary Tract: Negative adrenals. No hydronephrosis or ureteral stone. Small right renal calculus. Unremarkable bladder. Stomach/Bowel: Enteric contrast moderately distends the stomach and proximal duodenum. The duodenum is patulous with underlying postoperative changes including sutures. There is a chronic transition to  collapsed duodenum at the level of the aortic crossing, but not causing a acute obstruction. No volvulus. There is diffuse fluid levels within small bowel. Fluid levels are also seen in the colon to the level of the transverse segment. There are a few distal colonic diverticula. Vascular/Lymphatic: No acute vascular abnormality. No mass or adenopathy. Reproductive:The right testicle is at the right inguinal canal, also seen on prior. Other: No ascites or pneumoperitoneum. Musculoskeletal: No acute abnormalities. IMPRESSION: 1. Colonic and small bowel fluid levels suggesting gastroenteritis/ileus. 2. Postoperative proximal duodenum which is chronically dilated. 3. 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 Recommend urology follow-up. 4. Small right renal calculus. Electronically Signed   By: Monte Fantasia M.D.   On: 03/07/2021 06:36   DG Chest Port 1 View  Result Date: 03/07/2021 CLINICAL DATA:  Vomiting EXAM: PORTABLE CHEST 1 VIEW COMPARISON:  12/13/2019 FINDINGS: Normal heart size and mediastinal contours. No acute infiltrate or edema. No effusion or pneumothorax. No acute osseous findings. Presumed bone island over the right glenoid. IMPRESSION: Negative chest. Electronically Signed   By: Monte Fantasia M.D.   On: 03/07/2021 04:41    ____________________________________________   PROCEDURES  Procedure(s) performed (including Critical Care):  .1-3 Lead EKG Interpretation Performed by: Paulette Blanch, MD Authorized by: Paulette Blanch, MD     Interpretation: abnormal     ECG rate:  120   ECG rate assessment: tachycardic     Rhythm: sinus tachycardia     Ectopy: none     Conduction: normal   Comments:     Patient placed on cardiac monitor to evaluate for arrhythmias     ____________________________________________   INITIAL IMPRESSION / ASSESSMENT AND PLAN / ED COURSE  As part of my medical decision making, I reviewed the  following data within the electronic MEDICAL RECORD NUMBER History obtained from family, Nursing notes reviewed and incorporated, Labs reviewed, EKG interpreted, Old chart reviewed, Radiograph reviewed and Notes from prior ED visits     25 year old male with cerebral palsy presenting with abdominal pain, N/V/D after returning from [place]. Differential diagnosis includes, but is not limited to, biliary disease (biliary colic, acute cholecystitis, cholangitis, choledocholithiasis, etc), intrathoracic causes for epigastric abdominal pain including ACS, gastritis, duodenitis, pancreatitis, small bowel or large bowel obstruction, abdominal aortic aneurysm, hernia, and ulcer(s).  Will obtain lab work, UA.  Initiate IV fluid resuscitation, IV analgesia/antiemetic.  Obtain CT abdomen/pelvis given patient's prior intestinal surgeries to evaluate for SBO.  Will reassess.  Clinical Course as of 03/07/21 0938  Thu Mar 07, 2021  0503 Updated patient and father on CBC, met B and chest x-ray results.  Patient complains of persistent pain and nausea.  Will administer additional IV analgesia and antiemetic.  Currently working on oral contrast for CT scan.  We will continue to monitor. [JS]  [phone] Updated patient and father on CT imaging result.  Patient is more comfortable and resting.  Remains tachycardic.  Will administer additional IV fluids.  Awaiting urine specimen, stool  studies and reassessment.  Patient was able to tolerate oral contrast without emesis.  Care will be transferred to the oncoming provider at change of shift. [JS]    Clinical Course User Index [JS] Paulette Blanch, MD     ____________________________________________   FINAL CLINICAL IMPRESSION(S) / ED DIAGNOSES  Final diagnoses:  Nausea vomiting and diarrhea  Generalized abdominal pain     ED Discharge Orders    None      *Please note:  Miguel Myers. was evaluated in Emergency Department on 03/07/2021 for the symptoms described in the  history of present illness. He was evaluated in the context of the global COVID-19 pandemic, which necessitated consideration that the patient might be at risk for infection with the SARS-CoV-2 virus that causes COVID-19. Institutional protocols and algorithms that pertain to the evaluation of patients at risk for COVID-19 are in a state of rapid change based on information released by regulatory bodies including the CDC and federal and state organizations. These policies and algorithms were followed during the patient's care in the ED.  Some ED evaluations and interventions may be delayed as a result of limited staffing during and the pandemic.*   Note:  This document was prepared using Dragon voice recognition software and may include unintentional dictation errors.   Paulette Blanch, MD 03/07/21 [phone]

## 2021-03-07 NOTE — ED Triage Notes (Signed)
Pt to triage via w/c with no distress noted; Pt accomp by father who reports N/V/D since yesterday with upper abd pain; st stools and emesis are "black"; just returned from [place] on Tues

## 2021-03-08 ENCOUNTER — Encounter: Payer: Self-pay | Admitting: *Deleted

## 2021-03-08 ENCOUNTER — Observation Stay: Payer: Medicare Other | Admitting: Certified Registered Nurse Anesthetist

## 2021-03-08 ENCOUNTER — Observation Stay
Admission: EM | Admit: 2021-03-08 | Discharge: 2021-03-08 | Disposition: A | Discharge status: Home / Self Care | Payer: Medicare Other | Attending: Internal Medicine | Admitting: Internal Medicine

## 2021-03-08 ENCOUNTER — Encounter
Admission: EM | Disposition: A | Discharge status: Home / Self Care | Payer: Self-pay | Source: Home / Self Care | Attending: Emergency Medicine

## 2021-03-08 ENCOUNTER — Other Ambulatory Visit: Payer: Self-pay

## 2021-03-08 DIAGNOSIS — K529 Noninfective gastroenteritis and colitis, unspecified: Secondary | ICD-10-CM

## 2021-03-08 DIAGNOSIS — R112 Nausea with vomiting, unspecified: Secondary | ICD-10-CM

## 2021-03-08 DIAGNOSIS — E876 Hypokalemia: Secondary | ICD-10-CM | POA: Insufficient documentation

## 2021-03-08 DIAGNOSIS — K2100 Gastro-esophageal reflux disease with esophagitis, without bleeding: Secondary | ICD-10-CM | POA: Diagnosis not present

## 2021-03-08 DIAGNOSIS — K449 Diaphragmatic hernia without obstruction or gangrene: Secondary | ICD-10-CM | POA: Diagnosis not present

## 2021-03-08 DIAGNOSIS — K920 Hematemesis: Secondary | ICD-10-CM

## 2021-03-08 DIAGNOSIS — G809 Cerebral palsy, unspecified: Secondary | ICD-10-CM | POA: Diagnosis present

## 2021-03-08 DIAGNOSIS — K921 Melena: Secondary | ICD-10-CM | POA: Diagnosis not present

## 2021-03-08 HISTORY — DX: Hematemesis: K92.0

## 2021-03-08 HISTORY — PX: ESOPHAGOGASTRODUODENOSCOPY (EGD) WITH PROPOFOL: SHX5813

## 2021-03-08 HISTORY — DX: Hypokalemia: E87.6

## 2021-03-08 LAB — CBC WITH DIFFERENTIAL/PLATELET
Abs Immature Granulocytes: 0.02 10*3/uL (ref 0.00–0.07)
Basophils Absolute: 0 10*3/uL (ref 0.0–0.1)
Basophils Relative: 0 %
Eosinophils Absolute: 0 10*3/uL (ref 0.0–0.5)
Eosinophils Relative: 0 %
HCT: 41.7 % (ref 39.0–52.0)
Hemoglobin: 14.2 g/dL (ref 13.0–17.0)
Immature Granulocytes: 0 %
Lymphocytes Relative: 13 %
Lymphs Abs: 1.2 10*3/uL (ref 0.7–4.0)
MCH: 29.6 pg (ref 26.0–34.0)
MCHC: 34.1 g/dL (ref 30.0–36.0)
MCV: 86.9 fL (ref 80.0–100.0)
Monocytes Absolute: 0.8 10*3/uL (ref 0.1–1.0)
Monocytes Relative: 9 %
Neutro Abs: 7.2 10*3/uL (ref 1.7–7.7)
Neutrophils Relative %: 78 %
Platelets: 204 10*3/uL (ref 150–400)
RBC: 4.8 MIL/uL (ref 4.22–5.81)
RDW: 13.2 % (ref 11.5–15.5)
WBC: 9.3 10*3/uL (ref 4.0–10.5)
nRBC: 0 % (ref 0.0–0.2)

## 2021-03-08 LAB — CBC
HCT: 36.8 % — ABNORMAL LOW (ref 39.0–52.0)
Hemoglobin: 12.6 g/dL — ABNORMAL LOW (ref 13.0–17.0)
MCH: 29.8 pg (ref 26.0–34.0)
MCHC: 34.2 g/dL (ref 30.0–36.0)
MCV: 87 fL (ref 80.0–100.0)
Platelets: 184 10*3/uL (ref 150–400)
RBC: 4.23 MIL/uL (ref 4.22–5.81)
RDW: 13.2 % (ref 11.5–15.5)
WBC: 8.5 10*3/uL (ref 4.0–10.5)
nRBC: 0 % (ref 0.0–0.2)

## 2021-03-08 LAB — BASIC METABOLIC PANEL
Anion gap: 10 (ref 5–15)
BUN: 11 mg/dL (ref 6–20)
CO2: 23 mmol/L (ref 22–32)
Calcium: 8.4 mg/dL — ABNORMAL LOW (ref 8.9–10.3)
Chloride: 103 mmol/L (ref 98–111)
Creatinine, Ser: 0.65 mg/dL (ref 0.61–1.24)
GFR, Estimated: >60 mL/min (ref >60)
Glucose, Bld: 104 mg/dL — ABNORMAL HIGH (ref 70–99)
Potassium: 3 mmol/L — ABNORMAL LOW (ref 3.5–5.1)
Sodium: 136 mmol/L (ref 135–145)

## 2021-03-08 LAB — COMPREHENSIVE METABOLIC PANEL
ALT: 26 U/L (ref 0–44)
AST: 27 U/L (ref 15–41)
Albumin: 4.1 g/dL (ref 3.5–5.0)
Alkaline Phosphatase: 101 U/L (ref 38–126)
Anion gap: 11 (ref 5–15)
BUN: 9 mg/dL (ref 6–20)
CO2: 26 mmol/L (ref 22–32)
Calcium: 8.9 mg/dL (ref 8.9–10.3)
Chloride: 101 mmol/L (ref 98–111)
Creatinine, Ser: 0.65 mg/dL (ref 0.61–1.24)
GFR, Estimated: >60 mL/min (ref >60)
Glucose, Bld: 129 mg/dL — ABNORMAL HIGH (ref 70–99)
Potassium: 2.7 mmol/L — CL (ref 3.5–5.1)
Sodium: 138 mmol/L (ref 135–145)
Total Bilirubin: 1.3 mg/dL — ABNORMAL HIGH (ref 0.3–1.2)
Total Protein: 7.7 g/dL (ref 6.5–8.1)

## 2021-03-08 LAB — TYPE AND SCREEN
ABO/RH(D): O POS
Antibody Screen: NEGATIVE

## 2021-03-08 LAB — POTASSIUM: Potassium: 3.2 mmol/L — ABNORMAL LOW (ref 3.5–5.1)

## 2021-03-08 LAB — MAGNESIUM: Magnesium: 1.8 mg/dL (ref 1.7–2.4)

## 2021-03-08 LAB — HIV ANTIBODY (ROUTINE TESTING W REFLEX): HIV Screen 4th Generation wRfx: NONREACTIVE

## 2021-03-08 SURGERY — ESOPHAGOGASTRODUODENOSCOPY (EGD) WITH PROPOFOL
Anesthesia: General

## 2021-03-08 MED ORDER — PROPOFOL 500 MG/50ML IV EMUL
INTRAVENOUS | Status: DC | PRN
  Administered 2021-03-08: 150 ug/kg/min via INTRAVENOUS

## 2021-03-08 MED ORDER — POTASSIUM CHLORIDE 10 MEQ/100ML IV SOLN
10.0000 meq | INTRAVENOUS | Status: AC
  Administered 2021-03-08 (×2): 10 meq via INTRAVENOUS
  Filled 2021-03-08 (×2): qty 100

## 2021-03-08 MED ORDER — GLYCOPYRROLATE 0.2 MG/ML IJ SOLN
INTRAMUSCULAR | Status: DC | PRN
  Administered 2021-03-08: .1 mg via INTRAVENOUS

## 2021-03-08 MED ORDER — SODIUM CHLORIDE 0.9 % IV SOLN
8.0000 mg/h | INTRAVENOUS | Status: DC
  Administered 2021-03-08 (×2): 8 mg/h via INTRAVENOUS
  Filled 2021-03-08: qty 80

## 2021-03-08 MED ORDER — LACTATED RINGERS IV BOLUS
1000.0000 mL | Freq: Once | INTRAVENOUS | Status: AC
  Administered 2021-03-08: 1000 mL via INTRAVENOUS

## 2021-03-08 MED ORDER — LIDOCAINE HCL (CARDIAC) PF 100 MG/5ML IV SOSY
PREFILLED_SYRINGE | INTRAVENOUS | Status: DC | PRN
  Administered 2021-03-08: 40 mg via INTRAVENOUS

## 2021-03-08 MED ORDER — SODIUM CHLORIDE 0.9 % IV SOLN: INTRAVENOUS | Status: DC

## 2021-03-08 MED ORDER — OMEPRAZOLE MAGNESIUM 20 MG PO TBEC: 40.0000 mg | DELAYED_RELEASE_TABLET | Freq: Two times a day (BID) | ORAL | 0 refills | Status: DC

## 2021-03-08 MED ORDER — LIDOCAINE HCL (PF) 2 % IJ SOLN
INTRAMUSCULAR | Status: AC
  Filled 2021-03-08: qty 2

## 2021-03-08 MED ORDER — ONDANSETRON HCL 4 MG PO TABS: 4.0000 mg | ORAL_TABLET | Freq: Four times a day (QID) | ORAL | Status: DC | PRN

## 2021-03-08 MED ORDER — POTASSIUM CHLORIDE CRYS ER 20 MEQ PO TBCR: 40.0000 meq | EXTENDED_RELEASE_TABLET | Freq: Once | ORAL | Status: AC

## 2021-03-08 MED ORDER — POTASSIUM CHLORIDE 10 MEQ/100ML IV SOLN
10.0000 meq | Freq: Once | INTRAVENOUS | Status: AC
  Administered 2021-03-08: 10 meq via INTRAVENOUS

## 2021-03-08 MED ORDER — SODIUM CHLORIDE 0.9 % IV SOLN
80.0000 mg | Freq: Once | INTRAVENOUS | Status: DC
  Filled 2021-03-08: qty 80

## 2021-03-08 MED ORDER — LACTATED RINGERS IV SOLN: INTRAVENOUS | Status: DC

## 2021-03-08 MED ORDER — PROPOFOL 10 MG/ML IV BOLUS
INTRAVENOUS | Status: DC | PRN
  Administered 2021-03-08: 30 mg via INTRAVENOUS
  Administered 2021-03-08: 50 mg via INTRAVENOUS
  Administered 2021-03-08: 20 mg via INTRAVENOUS

## 2021-03-08 MED ORDER — ACETAMINOPHEN 650 MG RE SUPP: 650.0000 mg | Freq: Four times a day (QID) | RECTAL | Status: DC | PRN

## 2021-03-08 MED ORDER — MORPHINE SULFATE (PF) 2 MG/ML IV SOLN
2.0000 mg | INTRAVENOUS | Status: DC | PRN
Start: 2021-03-08 — End: 2021-03-08

## 2021-03-08 MED ORDER — POTASSIUM CHLORIDE 10 MEQ/100ML IV SOLN
10.0000 meq | Freq: Two times a day (BID) | INTRAVENOUS | Status: DC
  Administered 2021-03-08: 10 meq via INTRAVENOUS
  Filled 2021-03-08: qty 100

## 2021-03-08 MED ORDER — PANTOPRAZOLE SODIUM 40 MG IV SOLR: 40.0000 mg | Freq: Two times a day (BID) | INTRAVENOUS | Status: DC

## 2021-03-08 MED ORDER — SODIUM CHLORIDE 0.9 % IV SOLN
12.5000 mg | Freq: Four times a day (QID) | INTRAVENOUS | Status: DC | PRN
  Filled 2021-03-08: qty 0.5

## 2021-03-08 MED ORDER — KETOROLAC TROMETHAMINE 30 MG/ML IJ SOLN
15.0000 mg | Freq: Once | INTRAMUSCULAR | Status: AC
  Administered 2021-03-08: 15 mg via INTRAVENOUS
  Filled 2021-03-08: qty 1

## 2021-03-08 MED ORDER — POTASSIUM CHLORIDE CRYS ER 20 MEQ PO TBCR: 40.0000 meq | EXTENDED_RELEASE_TABLET | Freq: Once | ORAL | Status: DC

## 2021-03-08 MED ORDER — PROPOFOL 500 MG/50ML IV EMUL
INTRAVENOUS | Status: AC
  Filled 2021-03-08: qty 50

## 2021-03-08 MED ORDER — ACETAMINOPHEN 325 MG PO TABS: 650.0000 mg | ORAL_TABLET | Freq: Four times a day (QID) | ORAL | Status: DC | PRN

## 2021-03-08 MED ORDER — POTASSIUM CHLORIDE 10 MEQ/100ML IV SOLN
10.0000 meq | INTRAVENOUS | Status: AC
  Administered 2021-03-08: 10 meq via INTRAVENOUS
  Filled 2021-03-08 (×3): qty 100

## 2021-03-08 MED ORDER — POTASSIUM CHLORIDE CRYS ER 20 MEQ PO TBCR
EXTENDED_RELEASE_TABLET | ORAL | Status: AC
  Administered 2021-03-08: 40 meq via ORAL
  Filled 2021-03-08: qty 2

## 2021-03-08 MED ORDER — ONDANSETRON HCL 4 MG/2ML IJ SOLN
4.0000 mg | Freq: Once | INTRAMUSCULAR | Status: AC
  Administered 2021-03-08: 4 mg via INTRAVENOUS
  Filled 2021-03-08: qty 2

## 2021-03-08 MED ORDER — ONDANSETRON HCL 4 MG/2ML IJ SOLN: 4.0000 mg | Freq: Four times a day (QID) | INTRAMUSCULAR | Status: DC | PRN

## 2021-03-08 MED ORDER — POTASSIUM CHLORIDE 20 MEQ PO PACK: 40.0000 meq | PACK | Freq: Once | ORAL | Status: DC

## 2021-03-08 NOTE — ED Notes (Signed)
Pt is having N/V. Gown changed. Family in room at this time. Will continue to monitor for changes. Call light within reach.

## 2021-03-08 NOTE — Transfer of Care (Signed)
Immediate Anesthesia Transfer of Care Note  Patient: Miguel Myers.  Procedure(s) Performed: ESOPHAGOGASTRODUODENOSCOPY (EGD) WITH PROPOFOL (N/A )  Patient Location: PACU  Anesthesia Type:General  Level of Consciousness: sedated  Airway & Oxygen Therapy: Patient Spontanous Breathing  Post-op Assessment: Report given to RN and Post -op Vital signs reviewed and stable  Post vital signs: Reviewed and stable  Last Vitals:  Vitals Value Taken Time  BP 98/59 03/08/21 1319  Temp    Pulse 102 03/08/21 1319  Resp 29 03/08/21 1319  SpO2 98 % 03/08/21 1319  Vitals shown include unvalidated device data.  Last Pain:  Vitals:   03/08/21 0726  TempSrc:   PainSc: 0-No pain         Complications: No complications documented.

## 2021-03-08 NOTE — Anesthesia Postprocedure Evaluation (Signed)
Anesthesia Post Note  Patient: Miguel Myers.  Procedure(s) Performed: ESOPHAGOGASTRODUODENOSCOPY (EGD) WITH PROPOFOL (N/A )  Patient location during evaluation: Phase II Anesthesia Type: General Level of consciousness: awake and alert, awake and oriented Pain management: pain level controlled Vital Signs Assessment: post-procedure vital signs reviewed and stable Respiratory status: spontaneous breathing, nonlabored ventilation and respiratory function stable Cardiovascular status: blood pressure returned to baseline and stable Postop Assessment: no apparent nausea or vomiting Anesthetic complications: no   No complications documented.   Last Vitals:  Vitals:   03/08/21 1329 03/08/21 1336  BP: 102/65 (!) 123/92  Pulse: (!) 105 (!) 111  Resp: (!) 26 17  Temp:  (!) 36.4 C  SpO2: 96% 96%    Last Pain:  Vitals:   03/08/21 1336  TempSrc: Temporal  PainSc: 3                  Manfred Arch

## 2021-03-08 NOTE — Anesthesia Procedure Notes (Signed)
Date/Time: 03/08/2021 12:58 PM Performed by: Ginger Carne, CRNA Pre-anesthesia Checklist: Patient identified, Emergency Drugs available, Suction available, Patient being monitored and Timeout performed Patient Re-evaluated:Patient Re-evaluated prior to induction Oxygen Delivery Method: Nasal cannula Preoxygenation: Pre-oxygenation with 100% oxygen Induction Type: IV induction

## 2021-03-08 NOTE — H&P (Signed)
History and Physical    Miguel Myers. [id number] DOB: 01-27-1996 DOA: 03/08/2021  PCP: Zenaida Niece, MD   Patient coming from: Home  I have personally briefly reviewed patient's old medical records in [facility]  Chief Complaint: Persistent vomiting  HPI: Miguel Myers. is a 25 y.o. male with medical history significant for Cerebral palsy, seen in the ER the day prior for vomiting and diarrhea that started after returning from a trip to [place], who returns to the ER with protracted vomiting now with coffee-ground emesis.  He no longer has diarrhea.  He has generalized abdominal pain.  Denies fever or chills.  Has no cough ED course: On arrival, afebrile at 98.7, BP 112/69, tachycardic at 117, O2 sat 96% on room air.  Blood work significant for normal WBC of 9300, hemoglobin of 14.2, down from 16.5 the day prior.  Potassium 2.7, bilirubin slightly elevated at 1.3.  Gastroccult positive Imaging: Patient had CT abdomen and pelvis less than 24 hours prior that showed colonic and small bowel fluid levels suggesting gastroenteritis/ileus as well as chronic dilatation of postoperative proximal duodenum Chest x-ray from less than 24 hours prior showed no acute findings EKG, personally viewed and interpreted: Sinus tachycardia at 115 with nonspecific ST-T wave changes  Patient was started on IV fluid bolus as well as IV potassium.  Hospitalist consulted for admission.  Review of Systems: unable to obtain due to frailty, CP.   Past Medical History:  Diagnosis Date  . Cerebral palsy (HCC)     History reviewed. No pertinent surgical history.   reports that he has never smoked. He has never used smokeless tobacco. He reports that he does not drink alcohol and does not use drugs.  No Known Allergies  History reviewed. No pertinent family history.    Prior to Admission medications   Medication Sig Start Date End Date Taking? Authorizing Provider  baclofen  (LIORESAL) 10 MG tablet Take 10 mg by mouth 3 (three) times daily.    [provider]  cephALEXin (KEFLEX) 500 MG capsule Take 1 capsule (500 mg total) by mouth 4 (four) times daily. Patient not taking: Reported on 09/22/2016 02/09/16   Harolyn Rutherford C, PA-C  docusate sodium (COLACE) 100 MG capsule Take 1 capsule (100 mg total) by mouth every 12 (twelve) hours. 12/13/19   Antony Madura, PA-C  ibuprofen (ADVIL,MOTRIN) 800 MG tablet Take 1 tablet (800 mg total) by mouth 3 (three) times daily. Patient not taking: Reported on 09/22/2016 02/09/16   [name], [name], PA-C  ondansetron (ZOFRAN ODT) 4 MG disintegrating tablet Take 1 tablet (4 mg total) by mouth every 8 (eight) hours as needed for nausea or vomiting. 03/07/21   Triplett, Cari B, FNP  polyethylene glycol powder (MIRALAX) powder Take 0.5 Containers by mouth daily as needed for mild constipation. constipation 05/05/11   [provider]  traZODone (DESYREL) 50 MG tablet Take 50 mg by mouth at bedtime.    [provider]    Physical Exam: Vitals:   03/08/21 0209 03/08/21 0214  Pulse: (!) 117   Resp: 18   Temp: 98.7 F (37.1 C)   TempSrc: Oral   SpO2: 96%   Weight:  61.2 kg  Height:  4\' 10"  (1.473 m)     Vitals:   03/08/21 0209 03/08/21 0214  Pulse: (!) 117   Resp: 18   Temp: 98.7 F (37.1 C)   TempSrc: Oral   SpO2: 96%   Weight:  61.2  kg  Height:  4\' 10"  (1.473 m)    Constitutional: Frail appearing . Not in any apparent distress HEENT:      Head: Normocephalic and atraumatic.         Eyes: PERLA, EOMI, Conjunctivae are normal. Sclera is non-icteric.       Mouth/Throat: Mucous membranes are moist.       Neck: Supple with no signs of meningismus. Cardiovascular:  Tachycardic. No murmurs, gallops, or rubs. 2+ symmetrical distal pulses are present . No JVD. No LE edema Respiratory: Respiratory effort normal .Lungs sounds clear bilaterally. No wheezes, crackles, or rhonchi.  Gastrointestinal: Soft, diffusely  tender, and non distended with positive bowel sounds.  Genitourinary: No CVA tenderness. Musculoskeletal:. No cyanosis, or erythema of extremities. Distrophic extremities due to CP Neurologic:  Face is symmetric. Moving all extremities. No gross focal neurologic deficits . Skin: Skin is warm, dry.  No rash or ulcers Psychiatric: Mood and affect are normal    Labs on Admission: I have personally reviewed following labs and imaging studies  CBC: Recent Labs  Lab 03/07/21 0354 03/08/21 0223  WBC 12.5* 9.3  NEUTROABS 9.1* 7.2  HGB 16.5 14.2  HCT 49.1 41.7  MCV 87.7 86.9  PLT 236 204   Basic Metabolic Panel: Recent Labs  Lab 03/07/21 0354 03/08/21 0223  NA 137 138  K 4.2 2.7*  CL 103 101  CO2 19* 26  GLUCOSE 128* 129*  BUN 16 9  CREATININE 0.85 0.65  CALCIUM 9.7 8.9   GFR: Estimated Creatinine Clearance: 102.3 mL/min (by C-G formula based on SCr of 0.65 mg/dL). Liver Function Tests: Recent Labs  Lab 03/07/21 0354 03/08/21 0223  AST 45* 27  ALT 33 26  ALKPHOS 127* 101  BILITOT 2.0* 1.3*  PROT 8.5* 7.7  ALBUMIN 4.6 4.1   Recent Labs  Lab 03/07/21 0354  LIPASE 28   No results for input(s): AMMONIA in the last 168 hours. Coagulation Profile: No results for input(s): INR, PROTIME in the last 168 hours. Cardiac Enzymes: No results for input(s): CKTOTAL, CKMB, CKMBINDEX, TROPONINI in the last 168 hours. BNP (last 3 results) No results for input(s): PROBNP in the last 8760 hours. HbA1C: No results for input(s): HGBA1C in the last 72 hours. CBG: No results for input(s): GLUCAP in the last 168 hours. Lipid Profile: No results for input(s): CHOL, HDL, LDLCALC, TRIG, CHOLHDL, LDLDIRECT in the last 72 hours. Thyroid Function Tests: No results for input(s): TSH, T4TOTAL, FREET4, T3FREE, THYROIDAB in the last 72 hours. Anemia Panel: No results for input(s): VITAMINB12, FOLATE, FERRITIN, TIBC, IRON, RETICCTPCT in the last 72 hours. Urine analysis:    Component  Value Date/Time   COLORURINE YELLOW (A) 03/07/2021 0930   APPEARANCEUR CLEAR (A) 03/07/2021 0930   LABSPEC 1.016 03/07/2021 0930   PHURINE 5.0 03/07/2021 0930   GLUCOSEU 50 (A) 03/07/2021 0930   HGBUR NEGATIVE 03/07/2021 0930   BILIRUBINUR NEGATIVE 03/07/2021 0930   KETONESUR 20 (A) 03/07/2021 0930   PROTEINUR NEGATIVE 03/07/2021 0930   NITRITE NEGATIVE 03/07/2021 0930   LEUKOCYTESUR NEGATIVE 03/07/2021 0930    Radiological Exams on Admission: CT Abdomen Pelvis Wo Contrast  Result Date: 03/07/2021 CLINICAL DATA:  Nausea and vomiting with abdominal pain EXAM: CT ABDOMEN AND PELVIS WITHOUT CONTRAST TECHNIQUE: Multidetector CT imaging of the abdomen and pelvis was performed following the standard protocol without IV contrast. COMPARISON:  12/13/2019 FINDINGS: Lower chest: Mild dependent atelectasis or scarring. Contrast distends the lower esophagus, likely related to history of vomiting. Hepatobiliary:  No focal liver abnormality.No evidence of biliary obstruction or stone. Pancreas: Unremarkable. Spleen: Unremarkable. Adrenals/Urinary Tract: Negative adrenals. No hydronephrosis or ureteral stone. Small right renal calculus. Unremarkable bladder. Stomach/Bowel: Enteric contrast moderately distends the stomach and proximal duodenum. The duodenum is patulous with underlying postoperative changes including sutures. There is a chronic transition to collapsed duodenum at the level of the aortic crossing, but not causing a acute obstruction. No volvulus. There is diffuse fluid levels within small bowel. Fluid levels are also seen in the colon to the level of the transverse segment. There are a few distal colonic diverticula. Vascular/Lymphatic: No acute vascular abnormality. No mass or adenopathy. Reproductive:The right testicle is at the right inguinal canal, also seen on prior. Other: No ascites or pneumoperitoneum. Musculoskeletal: No acute abnormalities. IMPRESSION: 1. Colonic and small bowel fluid levels  suggesting gastroenteritis/ileus. 2. Postoperative proximal duodenum which is chronically dilated. 3. 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 Recommend urology follow-up. 4. Small right renal calculus. Electronically Signed   By: Marnee Spring M.D.   On: 03/07/2021 06:36   DG Chest Port 1 View  Result Date: 03/07/2021 CLINICAL DATA:  Vomiting EXAM: PORTABLE CHEST 1 VIEW COMPARISON:  12/13/2019 FINDINGS: Normal heart size and mediastinal contours. No acute infiltrate or edema. No effusion or pneumothorax. No acute osseous findings. Presumed bone island over the right glenoid. IMPRESSION: Negative chest. Electronically Signed   By: Marnee Spring M.D.   On: 03/07/2021 04:41     Assessment/Plan 25 year old male with cerebral palsy, treated the day prior in the ER for acute gastroenteritis on returning from a trip to [place], returning with protracted vomiting with coffee-ground emesis.   Coffee-ground emesis Intractable vomiting in the setting of acute gastroenteritis - Treated for acute gastroenteritis on 5/12 - Diarrhea has resolved but patient has persistent vomiting with coffee-ground- gastroccult positive - Hemoglobin 14.2 but down from 16.5 the day prior - Continue Protonix infusion.  Was bolused in the ER - Keep n.p.o. for now - IV fluids -Continue to trend hemoglobin - GI consult    Hypokalemia - Continue IV repletion and monitor    Cerebral palsy (HCC) - Increase nursing assistance as needed    DVT prophylaxis: SCDs Code Status: full code  Family Communication:  none  Disposition Plan: Back to previous home environment Consults called: GI Status: Observation    Andris Baumann MD [facility]     03/08/2021, 4:19 AM

## 2021-03-08 NOTE — ED Notes (Signed)
OR RN at bedside to take pt to OR.   IV team just eft after placing new 20G IV, K+ pulled from pyxis and given to OR RN to hang

## 2021-03-08 NOTE — Consult Note (Signed)
Cephas Darby, MD [address]  [place], [place] [address]  Main: [phone]  Fax: [phone] Pager: [phone]   Consultation  Referring Provider:     No ref. provider found Primary Care Physician:  Elon Alas, MD Primary Gastroenterologist: Althia Forts         Reason for Consultation:     Coffee-ground emesis, melena  Date of Admission:  03/08/2021 Date of Consultation:  03/08/2021         HPI:   Miguel Myers. is a 25 y.o. male with history of cerebral palsy, otherwise healthy presented with episodes of diarrhea which were black as well as coffee-ground emesis that started since Wednesday.  Patient returned from [place] about a week ago.  Patient is accompanied by his father who reported that patient had at least 4 episodes of black watery bowel movements on Wednesday followed by nausea, vomiting which was coffee-ground material.  Patient also complained of epigastric pain.  In the ER, he was hemodynamically stable other than mild tachycardia, labs revealed mild leukocytosis, hemoglobin dropped from 16.5 on admission to 12.6 today, normal platelets, normal PT/INR, normal BUN/creatinine, hypokalemia.  LFTs normal.  He underwent CT abdomen and pelvis without contrast which revealed fluid levels in the colon and small bowel suggestive of gastroenteritis/ileus.  He had postop changes in the duodenum.  Patient is kept n.p.o., started on pantoprazole drip, GI is consulted for further evaluation Patient is a good historian No history of smoking or alcohol use  NSAIDs: None  Antiplts/Anticoagulants/Anti thrombotics: None  GI Procedures: None  Past Medical History:  Diagnosis Date  . Cerebral palsy ([place])     History reviewed. No pertinent surgical history.  Prior to Admission medications   Medication Sig Start Date End Date Taking? Authorizing Provider  baclofen (LIORESAL) 10 MG tablet Take 10 mg by mouth daily.   Yes [provider]   DULoxetine (CYMBALTA) 30 MG capsule Take 60 mg by mouth daily. 02/26/21  Yes [provider]  meloxicam (MOBIC) 7.5 MG tablet Take 7.5 mg by mouth daily.   Yes [provider]  traZODone (DESYREL) 50 MG tablet Take 100 mg by mouth at bedtime.   Yes [provider]  ondansetron (ZOFRAN ODT) 4 MG disintegrating tablet Take 1 tablet (4 mg total) by mouth every 8 (eight) hours as needed for nausea or vomiting. 03/07/21   Triplett, Dessa Phi, FNP    Current Facility-Administered Medications:  .  0.9 %  sodium chloride infusion, , Intravenous, Continuous, Keyuna Cuthrell, Tally Due, MD, Last Rate: 20 mL/hr at 03/08/21 1244, Continued from Pre-op at 03/08/21 1244 .  [MAR Hold] acetaminophen (TYLENOL) tablet 650 mg, 650 mg, Oral, Q6H PRN **OR** [MAR Hold] acetaminophen (TYLENOL) suppository 650 mg, 650 mg, Rectal, Q6H PRN, Athena Masse, MD .  lactated ringers infusion, , Intravenous, Continuous, Athena Masse, MD, Last Rate: 125 mL/hr at 03/08/21 0902, New Bag at 03/08/21 0902 .  [MAR Hold] morphine 2 MG/ML injection 2 mg, 2 mg, Intravenous, Q2H PRN, Athena Masse, MD .  Doug Sou Hold] ondansetron [facility]) tablet 4 mg, 4 mg, Oral, Q6H PRN **OR** [MAR Hold] ondansetron (ZOFRAN) injection 4 mg, 4 mg, Intravenous, Q6H PRN, Judd Gaudier V, MD .  pantoprazole (PROTONIX) 80 mg in sodium chloride 0.9 % 100 mL (0.8 mg/mL) infusion, 8 mg/hr, Intravenous, Continuous, Veronese, [place], MD, Last Rate: 10 mL/hr at 03/08/21 0901, 8 mg/hr at 03/08/21 0901 .  [MAR Hold] pantoprazole (PROTONIX) 80  mg in sodium chloride 0.9 % 100 mL IVPB, 80 mg, Intravenous, Once, Alfred Levins, [place], MD .  Doug Sou Hold] pantoprazole (PROTONIX) injection 40 mg, 40 mg, Intravenous, Q12H, Alfred Levins, [place], MD .  Doug Sou Hold] potassium chloride 10 mEq in 100 mL IVPB, 10 mEq, Intravenous, BID, Nolberto Hanlon, MD, Last Rate: 100 mL/hr at 03/08/21 1210, 10 mEq at 03/08/21 1210 .  [MAR Hold] promethazine (PHENERGAN) 12.5 mg in sodium  chloride 0.9 % 50 mL IVPB, 12.5 mg, Intravenous, Q6H PRN, Athena Masse, MD  History reviewed. No pertinent family history.   Social History   Tobacco Use  . Smoking status: Never Smoker  . Smokeless tobacco: Never Used  Substance Use Topics  . Alcohol use: No  . Drug use: No    Allergies as of 03/08/2021  . (No Known Allergies)    Review of Systems:    All systems reviewed and negative except where noted in HPI.   Physical Exam:  Vital signs in last 24 hours: Temp:  [98.7 F (37.1 C)] 98.7 F (37.1 C) (05/13 0209) Pulse Rate:  [101-117] 107 (05/13 1000) Resp:  [11-20] 20 (05/13 1000) BP: (110)/(60-61) 110/60 (05/13 1000) SpO2:  [96 %-98 %] 96 % (05/13 1000) Weight:  [61.2 kg] 61.2 kg (05/13 0214)   General:   Pleasant, cooperative in NAD Head:  Normocephalic and atraumatic. Eyes:   No icterus.   Conjunctiva pink. PERRLA. Ears:  Normal auditory acuity. Neck:  Supple; no masses or thyroidomegaly Lungs: Respirations even and unlabored. Lungs clear to auscultation bilaterally.   No wheezes, crackles, or rhonchi.  Heart:  Regular rate and rhythm;  Without murmur, clicks, rubs or gallops Abdomen:  Soft, nondistended, nontender. Normal bowel sounds. No appreciable masses or hepatomegaly.  No rebound or guarding.  Rectal:  Not performed. Msk:  Symmetrical,  Atrophic bilateral hands Extremities:  Without edema, cyanosis or clubbing. Neurologic:  Alert and oriented x3; cerebral palsy Skin:  Intact without significant lesions or rashes. Psych:  Alert and cooperative. Normal affect.  LAB RESULTS: CBC Latest Ref Rng & Units 03/08/2021 03/08/2021 03/07/2021  WBC 4.0 - 10.5 K/uL 8.5 9.3 12.5(H)  Hemoglobin 13.0 - 17.0 g/dL 12.6(L) 14.2 16.5  Hematocrit 39.0 - 52.0 % 36.8(L) 41.7 49.1  Platelets 150 - 400 K/uL 184 204 236    BMET BMP Latest Ref Rng & Units 03/08/2021 03/08/2021 03/07/2021  Glucose 70 - 99 mg/dL 104(H) 129(H) 128(H)  BUN 6 - 20 mg/dL $Remove'11 9 16  'JvTRYHg$ Creatinine 0.61 -  1.24 mg/dL 0.65 0.65 0.85  Sodium 135 - 145 mmol/L 136 138 137  Potassium 3.5 - 5.1 mmol/L 3.0(L) 2.7(LL) 4.2  Chloride 98 - 111 mmol/L 103 101 103  CO2 22 - 32 mmol/L 23 26 19(L)  Calcium 8.9 - 10.3 mg/dL 8.4(L) 8.9 9.7    LFT Hepatic Function Latest Ref Rng & Units 03/08/2021 03/07/2021 12/13/2019  Total Protein 6.5 - 8.1 g/dL 7.7 8.5(H) 7.4  Albumin 3.5 - 5.0 g/dL 4.1 4.6 4.3  AST 15 - 41 U/L 27 45(H) 29  ALT 0 - 44 U/L 26 33 18  Alk Phosphatase 38 - 126 U/L 101 127(H) 98  Total Bilirubin 0.3 - 1.2 mg/dL 1.3(H) 2.0(H) 1.3(H)     STUDIES: CT Abdomen Pelvis Wo Contrast  Result Date: 03/07/2021 CLINICAL DATA:  Nausea and vomiting with abdominal pain EXAM: CT ABDOMEN AND PELVIS WITHOUT CONTRAST TECHNIQUE: Multidetector CT imaging of the abdomen and pelvis was performed following the standard protocol without IV contrast. COMPARISON:  12/13/2019 FINDINGS: Lower chest: Mild dependent atelectasis or scarring. Contrast distends the lower esophagus, likely related to history of vomiting. Hepatobiliary: No focal liver abnormality.No evidence of biliary obstruction or stone. Pancreas: Unremarkable. Spleen: Unremarkable. Adrenals/Urinary Tract: Negative adrenals. No hydronephrosis or ureteral stone. Small right renal calculus. Unremarkable bladder. Stomach/Bowel: Enteric contrast moderately distends the stomach and proximal duodenum. The duodenum is patulous with underlying postoperative changes including sutures. There is a chronic transition to collapsed duodenum at the level of the aortic crossing, but not causing a acute obstruction. No volvulus. There is diffuse fluid levels within small bowel. Fluid levels are also seen in the colon to the level of the transverse segment. There are a few distal colonic diverticula. Vascular/Lymphatic: No acute vascular abnormality. No mass or adenopathy. Reproductive:The right testicle is at the right inguinal canal, also seen on prior. Other: No ascites or  pneumoperitoneum. Musculoskeletal: No acute abnormalities. IMPRESSION: 1. Colonic and small bowel fluid levels suggesting gastroenteritis/ileus. 2. Postoperative proximal duodenum which is chronically dilated. 3. 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 Recommend urology follow-up. 4. Small right renal calculus. Electronically Signed   By: Monte Fantasia M.D.   On: 03/07/2021 06:36   DG Chest Port 1 View  Result Date: 03/07/2021 CLINICAL DATA:  Vomiting EXAM: PORTABLE CHEST 1 VIEW COMPARISON:  12/13/2019 FINDINGS: Normal heart size and mediastinal contours. No acute infiltrate or edema. No effusion or pneumothorax. No acute osseous findings. Presumed bone island over the right glenoid. IMPRESSION: Negative chest. Electronically Signed   By: Monte Fantasia M.D.   On: 03/07/2021 04:41      Impression / Plan:   Miguel Myers. is a 25 y.o. male with history of cerebral palsy, otherwise healthy return from [place] about a week ago presented with 2 days history of black watery bowel movements associated with nausea and coffee-ground emesis, acute blood loss anemia, mild Normal BUN/creatinine  Recommend upper endoscopy for further evaluation Continue pantoprazole drip Maintain n.p.o. until upper endoscopy If upper endoscopy is negative to identify the source of anemia, discussed with patient's father about possible colonoscopy  I have discussed alternative options, risks & benefits,  which include, but are not limited to, bleeding, infection, perforation,respiratory complication & drug reaction.  The patient agrees with this plan & written consent will be obtained.    Thank you for involving me in the care of this patient.      LOS: 0 days   Sherri Sear, MD  03/08/2021, 12:50 PM   Note: This dictation was prepared with Dragon dictation along with smaller phrase technology. Any transcriptional errors that result from this process are  unintentional.

## 2021-03-08 NOTE — Anesthesia Preprocedure Evaluation (Signed)
Anesthesia Evaluation  Patient identified by MRN, date of birth, ID band Patient awake    Reviewed: Allergy & Precautions, NPO status , Patient's Chart, lab work & pertinent test results  Airway Mallampati: II  TM Distance: >3 FB Neck ROM: Full    Dental no notable dental hx.    Pulmonary neg pulmonary ROS,    Pulmonary exam normal        Cardiovascular negative cardio ROS Normal cardiovascular exam     Neuro/Psych negative neurological ROS  negative psych ROS   GI/Hepatic negative GI ROS, Neg liver ROS,   Endo/Other  negative endocrine ROS  Renal/GU negative Renal ROS  negative genitourinary   Musculoskeletal negative musculoskeletal ROS (+)   Abdominal   Peds negative pediatric ROS (+)  Hematology negative hematology ROS (+)   Anesthesia Other Findings Cerebral Palsy  Reproductive/Obstetrics negative OB ROS                             Anesthesia Physical Anesthesia Plan  ASA: II  Anesthesia Plan: General   Post-op Pain Management:    Induction: Intravenous  PONV Risk Score and Plan: 2 and Propofol infusion and TIVA  Airway Management Planned: Natural Airway and Nasal Cannula  Additional Equipment:   Intra-op Plan:   Post-operative Plan:   Informed Consent: I have reviewed the patients History and Physical, chart, labs and discussed the procedure including the risks, benefits and alternatives for the proposed anesthesia with the patient or authorized representative who has indicated his/her understanding and acceptance.       Plan Discussed with: CRNA, Anesthesiologist and Surgeon  Anesthesia Plan Comments:         Anesthesia Quick Evaluation

## 2021-03-08 NOTE — ED Triage Notes (Signed)
Pt to ED via [facility] with complaints of N/V, his diarrhea has subsided. He was given po Zofran but has been unable to keep it down. Pt was seen here last night and discharged yesterday. Got back from [place] on Tuesday.

## 2021-03-08 NOTE — Discharge Summary (Signed)
Miguel Myers. LOV:[id number] DOB: [birth year] DOA: 03/08/2021  PCP: Zenaida Niece, MD  Admit date: 03/08/2021 Discharge date: 03/08/2021  Admitted From: home Disposition:  home  Recommendations for Outpatient Follow-up:  1. Follow up with PCP in 1 week 2. Please obtain BMP/CBC in one week 3. F/u with Dr. Allegra Lai , GI in 2 months     Discharge Condition:Stable CODE STATUS:full  Diet recommendation: as tolerated, regular  Brief/Interim Summary: Per HPI: Miguel Sheriff. is a 25 y.o. male with medical history significant for Cerebral palsy, seen in the ER the day prior for vomiting and diarrhea that started after returning from a trip to [place], who returns to the ER with protracted vomiting now with coffee-ground emesis.  GI was consulted.  Patient underwent endoscopy by Dr. Allegra Lai today.  Found LA grade D esophagitis with no bleeding in the lower third of the esophagus.  Medium size hiatal hernia.  Few superficial esophageal ulcers with no bleeding and no stigmata of recent bleeding in the lower third of esophagus.  The largest lesion was 5 mm in largest dimension.  Patient was cleared for discharge by GI today.  Recommended Prilosec 40 mg p.o. twice daily for 3 months and follow-up with GI in 2 months.  Patient was also found with hypokalemia due to vomiting. He was replaced by IV and PO potassium prior to discharge.    Discharge Diagnoses:  Principal Problem:   Acute gastroenteritis with intractable vomitng Active Problems:   Cerebral palsy (HCC)   Coffee ground emesis   Hypokalemia   Intractable nausea and vomiting    Discharge Instructions   Allergies as of 03/08/2021   No Known Allergies     Medication List    STOP taking these medications   baclofen 10 MG tablet Commonly known as: LIORESAL   meloxicam 7.5 MG tablet Commonly known as: MOBIC     TAKE these medications   DULoxetine 30 MG capsule Commonly known as: CYMBALTA Take 60 mg by mouth daily.    omeprazole 20 MG tablet Commonly known as: PriLOSEC OTC Take 2 tablets (40 mg total) by mouth 2 (two) times daily.   ondansetron 4 MG disintegrating tablet Commonly known as: Zofran ODT Take 1 tablet (4 mg total) by mouth every 8 (eight) hours as needed for nausea or vomiting.   traZODone 50 MG tablet Commonly known as: DESYREL Take 100 mg by mouth at bedtime.       Follow-up Information    Zenaida Niece, MD. Schedule an appointment as soon as possible for a visit in 2 days.   Specialty: Family Medicine Contact information: [address] [place] [address] [phone]        Toney Reil, MD Follow up in 2 month(s).   Specialty: Gastroenterology Contact information: [address] [place] [address] [phone]              No Known Allergies  Consultations:  GI   Procedures/Studies: CT Abdomen Pelvis Wo Contrast  Result Date: 03/07/2021 CLINICAL DATA:  Nausea and vomiting with abdominal pain EXAM: CT ABDOMEN AND PELVIS WITHOUT CONTRAST TECHNIQUE: Multidetector CT imaging of the abdomen and pelvis was performed following the standard protocol without IV contrast. COMPARISON:  12/13/2019 FINDINGS: Lower chest: Mild dependent atelectasis or scarring. Contrast distends the lower esophagus, likely related to history of vomiting. Hepatobiliary: No focal liver abnormality.No evidence of biliary obstruction or stone. Pancreas: Unremarkable. Spleen: Unremarkable. Adrenals/Urinary Tract: Negative adrenals. No hydronephrosis or ureteral stone.  Small right renal calculus. Unremarkable bladder. Stomach/Bowel: Enteric contrast moderately distends the stomach and proximal duodenum. The duodenum is patulous with underlying postoperative changes including sutures. There is a chronic transition to collapsed duodenum at the level of the aortic crossing, but not causing a acute obstruction. No volvulus. There is diffuse fluid levels within small bowel.  Fluid levels are also seen in the colon to the level of the transverse segment. There are a few distal colonic diverticula. Vascular/Lymphatic: No acute vascular abnormality. No mass or adenopathy. Reproductive:The right testicle is at the right inguinal canal, also seen on prior. Other: No ascites or pneumoperitoneum. Musculoskeletal: No acute abnormalities. IMPRESSION: 1. Colonic and small bowel fluid levels suggesting gastroenteritis/ileus. 2. Postoperative proximal duodenum which is chronically dilated. 3. 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 Recommend urology follow-up. 4. Small right renal calculus. Electronically Signed   By: Marnee Spring M.D.   On: 03/07/2021 06:36   DG Chest Port 1 View  Result Date: 03/07/2021 CLINICAL DATA:  Vomiting EXAM: PORTABLE CHEST 1 VIEW COMPARISON:  12/13/2019 FINDINGS: Normal heart size and mediastinal contours. No acute infiltrate or edema. No effusion or pneumothorax. No acute osseous findings. Presumed bone island over the right glenoid. IMPRESSION: Negative chest. Electronically Signed   By: Marnee Spring M.D.   On: 03/07/2021 04:41       Subjective: Father updated. Pt had vomiting prior to EGD, but none rported after  Discharge Exam: Vitals:   03/08/21 1336 03/08/21 1349  BP: (!) 123/92 125/85  Pulse: (!) 111 (!) 103  Resp: 17 18  Temp: (!) 97.5 F (36.4 C)   SpO2: 96% 98%   Vitals:   03/08/21 1319 03/08/21 1329 03/08/21 1336 03/08/21 1349  BP: (!) 98/59 102/65 (!) 123/92 125/85  Pulse: 94 (!) 105 (!) 111 (!) 103  Resp: (!) 26 (!) 26 17 18   Temp:   (!) 97.5 F (36.4 C)   TempSrc:   Temporal   SpO2: 99% 96% 96% 98%  Weight:      Height:        General: Pt is alert, awake, not in acute distress Cardiovascular: RRR, S1/S2 +, no rubs, no gallops Respiratory: CTA bilaterally, no wheezing, no rhonchi Abdominal: Soft, NT, ND, bowel sounds + Extremities: no edema    The  results of significant diagnostics from this hospitalization (including imaging, microbiology, ancillary and laboratory) are listed below for reference.     Microbiology: Recent Results (from the past 240 hour(s))  Resp Panel by RT-PCR (Flu A&B, Covid) Nasopharyngeal Swab     Status: None   Collection Time: 03/07/21  4:01 AM   Specimen: Nasopharyngeal Swab; Nasopharyngeal(NP) swabs in vial transport medium  Result Value Ref Range Status   SARS Coronavirus 2 by RT PCR NEGATIVE NEGATIVE Final    Comment: (NOTE) SARS-CoV-2 target nucleic acids are NOT DETECTED.  The SARS-CoV-2 RNA is generally detectable in upper respiratory specimens during the acute phase of infection. The lowest concentration of SARS-CoV-2 viral copies this assay can detect is 138 copies/mL. A negative result does not preclude SARS-Cov-2 infection and should not be used as the sole basis for treatment or other patient management decisions. A negative result may occur with  improper specimen collection/handling, submission of specimen other than nasopharyngeal swab, presence of viral mutation(s) within the areas targeted by this assay, and inadequate number of viral copies(<138 copies/mL). A negative result must be combined with clinical observations, patient history,  and epidemiological information. The expected result is Negative.  Fact Sheet for Patients:  BloggerCourse.com  Fact Sheet for Healthcare Providers:  SeriousBroker.it  This test is no t yet approved or cleared by the [place] FDA and  has been authorized for detection and/or diagnosis of SARS-CoV-2 by FDA under an Emergency Use Authorization (EUA). This EUA will remain  in effect (meaning this test can be used) for the duration of the COVID-19 declaration under Section 564(b)(1) of the Act, 21 U.S.C.section 360bbb-3(b)(1), unless the authorization is terminated  or revoked sooner.        Influenza A by PCR NEGATIVE NEGATIVE Final   Influenza B by PCR NEGATIVE NEGATIVE Final    Comment: (NOTE) The Xpert Xpress SARS-CoV-2/FLU/RSV plus assay is intended as an aid in the diagnosis of influenza from Nasopharyngeal swab specimens and should not be used as a sole basis for treatment. Nasal washings and aspirates are unacceptable for Xpert Xpress SARS-CoV-2/FLU/RSV testing.  Fact Sheet for Patients: BloggerCourse.com  Fact Sheet for Healthcare Providers: SeriousBroker.it  This test is not yet approved or cleared by the [place] FDA and has been authorized for detection and/or diagnosis of SARS-CoV-2 by FDA under an Emergency Use Authorization (EUA). This EUA will remain in effect (meaning this test can be used) for the duration of the COVID-19 declaration under Section 564(b)(1) of the Act, 21 U.S.C. section 360bbb-3(b)(1), unless the authorization is terminated or revoked.  Performed at [facility], [address] [place], [place] [address]      Labs: BNP (last 3 results) No results for input(s): BNP in the last 8760 hours. Basic Metabolic Panel: Recent Labs  Lab 03/07/21 0354 03/08/21 0223 03/08/21 0430 03/08/21 0945 03/08/21 1454  NA 137 138  --  136  --   K 4.2 2.7*  --  3.0* 3.2*  CL 103 101  --  103  --   CO2 19* 26  --  23  --   GLUCOSE 128* 129*  --  104*  --   BUN 16 9  --  11  --   CREATININE 0.85 0.65  --  0.65  --   CALCIUM 9.7 8.9  --  8.4*  --   MG  --   --  1.8  --   --    Liver Function Tests: Recent Labs  Lab 03/07/21 0354 03/08/21 0223  AST 45* 27  ALT 33 26  ALKPHOS 127* 101  BILITOT 2.0* 1.3*  PROT 8.5* 7.7  ALBUMIN 4.6 4.1   Recent Labs  Lab 03/07/21 0354  LIPASE 28   No results for input(s): AMMONIA in the last 168 hours. CBC: Recent Labs  Lab 03/07/21 0354 03/08/21 0223 03/08/21 0945  WBC 12.5* 9.3 8.5  NEUTROABS 9.1* 7.2  --   HGB 16.5 14.2  12.6*  HCT 49.1 41.7 36.8*  MCV 87.7 86.9 87.0  PLT 236 204 184   Cardiac Enzymes: No results for input(s): CKTOTAL, CKMB, CKMBINDEX, TROPONINI in the last 168 hours. BNP: Invalid input(s): POCBNP CBG: No results for input(s): GLUCAP in the last 168 hours. D-Dimer No results for input(s): DDIMER in the last 72 hours. Hgb A1c No results for input(s): HGBA1C in the last 72 hours. Lipid Profile No results for input(s): CHOL, HDL, LDLCALC, TRIG, CHOLHDL, LDLDIRECT in the last 72 hours. Thyroid function studies No results for input(s): TSH, T4TOTAL, T3FREE, THYROIDAB in the last 72 hours.  Invalid input(s): FREET3 Anemia work up No results for input(s):  VITAMINB12, FOLATE, FERRITIN, TIBC, IRON, RETICCTPCT in the last 72 hours. Urinalysis    Component Value Date/Time   COLORURINE YELLOW (A) 03/07/2021 0930   APPEARANCEUR CLEAR (A) 03/07/2021 0930   LABSPEC 1.016 03/07/2021 0930   PHURINE 5.0 03/07/2021 0930   GLUCOSEU 50 (A) 03/07/2021 0930   HGBUR NEGATIVE 03/07/2021 0930   BILIRUBINUR NEGATIVE 03/07/2021 0930   KETONESUR 20 (A) 03/07/2021 0930   PROTEINUR NEGATIVE 03/07/2021 0930   NITRITE NEGATIVE 03/07/2021 0930   LEUKOCYTESUR NEGATIVE 03/07/2021 0930   Sepsis Labs Invalid input(s): PROCALCITONIN,  WBC,  LACTICIDVEN Microbiology Recent Results (from the past 240 hour(s))  Resp Panel by RT-PCR (Flu A&B, Covid) Nasopharyngeal Swab     Status: None   Collection Time: 03/07/21  4:01 AM   Specimen: Nasopharyngeal Swab; Nasopharyngeal(NP) swabs in vial transport medium  Result Value Ref Range Status   SARS Coronavirus 2 by RT PCR NEGATIVE NEGATIVE Final    Comment: (NOTE) SARS-CoV-2 target nucleic acids are NOT DETECTED.  The SARS-CoV-2 RNA is generally detectable in upper respiratory specimens during the acute phase of infection. The lowest concentration of SARS-CoV-2 viral copies this assay can detect is 138 copies/mL. A negative result does not preclude  SARS-Cov-2 infection and should not be used as the sole basis for treatment or other patient management decisions. A negative result may occur with  improper specimen collection/handling, submission of specimen other than nasopharyngeal swab, presence of viral mutation(s) within the areas targeted by this assay, and inadequate number of viral copies(<138 copies/mL). A negative result must be combined with clinical observations, patient history, and epidemiological information. The expected result is Negative.  Fact Sheet for Patients:  BloggerCourse.comhttps://www.fda.gov/media/152166/download  Fact Sheet for Healthcare Providers:  SeriousBroker.ithttps://www.fda.gov/media/152162/download  This test is no t yet approved or cleared by the [place] FDA and  has been authorized for detection and/or diagnosis of SARS-CoV-2 by FDA under an Emergency Use Authorization (EUA). This EUA will remain  in effect (meaning this test can be used) for the duration of the COVID-19 declaration under Section 564(b)(1) of the Act, 21 U.S.C.section 360bbb-3(b)(1), unless the authorization is terminated  or revoked sooner.       Influenza A by PCR NEGATIVE NEGATIVE Final   Influenza B by PCR NEGATIVE NEGATIVE Final    Comment: (NOTE) The Xpert Xpress SARS-CoV-2/FLU/RSV plus assay is intended as an aid in the diagnosis of influenza from Nasopharyngeal swab specimens and should not be used as a sole basis for treatment. Nasal washings and aspirates are unacceptable for Xpert Xpress SARS-CoV-2/FLU/RSV testing.  Fact Sheet for Patients: BloggerCourse.comhttps://www.fda.gov/media/152166/download  Fact Sheet for Healthcare Providers: SeriousBroker.ithttps://www.fda.gov/media/152162/download  This test is not yet approved or cleared by the [place] FDA and has been authorized for detection and/or diagnosis of SARS-CoV-2 by FDA under an Emergency Use Authorization (EUA). This EUA will remain in effect (meaning this test can be used) for the duration of  the COVID-19 declaration under Section 564(b)(1) of the Act, 21 U.S.C. section 360bbb-3(b)(1), unless the authorization is terminated or revoked.  Performed at [facility], [address] [place], [place] [address]      Time coordinating discharge: Over 30 minutes  SIGNED:   Lynn ItoSahar Aleya Durnell, MD  [facility] 03/08/2021, 3:27 PM Pager   If 7PM-7AM, please contact night-coverage [email or web address] Password [email or web address]

## 2021-03-08 NOTE — ED Provider Notes (Signed)
[facility] Emergency Department Provider Note  ____________________________________________  Time seen: Approximately 2:17 AM  I have reviewed the triage vital signs and the nursing notes.   HISTORY  Chief Complaint Nausea and Emesis   HPI Miguel Myers. is a 25 y.o. male history of cerebral palsy who presents for evaluation of nausea and vomiting.  Patient was seen here yesterday for nausea, vomiting, diarrhea after returning from a trip to [place].  Mother is at bedside and reports that the diarrhea has subsided but patient continues to vomit since being discharged from the hospital yesterday.  Has had several episodes of emesis.  Patient is complaining of diffuse cramping abdominal pain associated with it.  No fevers.   Past Medical History:  Diagnosis Date  . Cerebral palsy [facility])     Patient Active Problem List   Diagnosis Date Noted  . Cerebral palsy (HCC) 09/15/2012    History reviewed. No pertinent surgical history.  Prior to Admission medications   Medication Sig Start Date End Date Taking? Authorizing Provider  baclofen (LIORESAL) 10 MG tablet Take 10 mg by mouth 3 (three) times daily.    [provider]  cephALEXin (KEFLEX) 500 MG capsule Take 1 capsule (500 mg total) by mouth 4 (four) times daily. Patient not taking: Reported on 09/22/2016 02/09/16   Harolyn Rutherford C, PA-C  docusate sodium (COLACE) 100 MG capsule Take 1 capsule (100 mg total) by mouth every 12 (twelve) hours. 12/13/19   Antony Madura, PA-C  ibuprofen (ADVIL,MOTRIN) 800 MG tablet Take 1 tablet (800 mg total) by mouth 3 (three) times daily. Patient not taking: Reported on 09/22/2016 02/09/16   [name], [name], PA-C  ondansetron (ZOFRAN ODT) 4 MG disintegrating tablet Take 1 tablet (4 mg total) by mouth every 8 (eight) hours as needed for nausea or vomiting. 03/07/21   Triplett, Cari B, FNP  polyethylene glycol powder (MIRALAX) powder Take 0.5 Containers by mouth daily as  needed for mild constipation. constipation 05/05/11   [provider]  traZODone (DESYREL) 50 MG tablet Take 50 mg by mouth at bedtime.    [provider]    Allergies Patient has no known allergies.  No family history on file.  Social History Social History   Tobacco Use  . Smoking status: Never Smoker  . Smokeless tobacco: Never Used  Substance Use Topics  . Alcohol use: No  . Drug use: No    Review of Systems  Constitutional: Negative for fever. Eyes: Negative for visual changes. ENT: Negative for sore throat. Neck: No neck pain  Cardiovascular: Negative for chest pain. Respiratory: Negative for shortness of breath. Gastrointestinal: + abdominal pain, nausea, and vomiting. No diarrhea. Genitourinary: Negative for dysuria. Musculoskeletal: Negative for back pain. Skin: Negative for rash. Neurological: Negative for headaches, weakness or numbness. Psych: No SI or HI  ____________________________________________   PHYSICAL EXAM:  VITAL SIGNS: ED Triage Vitals  Enc Vitals Group     BP --      Pulse Rate 03/08/21 0209 (!) 117     Resp 03/08/21 0209 18     Temp 03/08/21 0209 98.7 F (37.1 C)     Temp Source 03/08/21 0209 Oral     SpO2 03/08/21 0209 96 %     Weight 03/08/21 0214 134 lb 14.7 oz (61.2 kg)     Height 03/08/21 0214 4\' 10"  (1.473 m)     Head Circumference --      Peak Flow --  Pain Score 03/08/21 0212 9     Pain Loc --      Pain Edu? --      Excl. in GC? --     Constitutional: Alert and oriented, in no apparent distress. HEENT:      Head: Normocephalic and atraumatic.         Eyes: Conjunctivae are normal. Sclera is non-icteric.       Mouth/Throat: Mucous membranes are moist.       Neck: Supple with no signs of meningismus. Cardiovascular: Tachycardic with regular rhythm  respiratory: Normal respiratory effort. Lungs are clear to auscultation bilaterally.  Gastrointestinal: Soft, diffusely tender to palpation with no  localized tenderness, rebound or guarding Musculoskeletal:  No edema, cyanosis, or erythema of extremities. Neurologic: Normal speech and language. Face is symmetric. Moving all extremities. No gross focal neurologic deficits are appreciated. Skin: Skin is warm, dry and intact. No rash noted. Psychiatric: Mood and affect are normal. Speech and behavior are normal.  ____________________________________________   LABS (all labs ordered are listed, but only abnormal results are displayed)  Labs Reviewed  COMPREHENSIVE METABOLIC PANEL - Abnormal; Notable for the following components:      Result Value   Potassium 2.7 (*)    Glucose, Bld 129 (*)    Total Bilirubin 1.3 (*)    All other components within normal limits  CBC WITH DIFFERENTIAL/PLATELET  MAGNESIUM  TYPE AND SCREEN   ____________________________________________  EKG  none  ____________________________________________  RADIOLOGY  none  ____________________________________________   PROCEDURES  Procedure(s) performed: yes .1-3 Lead EKG Interpretation Performed by: Nita Sickle, MD Authorized by: Nita Sickle, MD     Interpretation: non-specific     ECG rate assessment: tachycardic     Rhythm: sinus tachycardia     Ectopy: none     Conduction: normal     Critical Care performed:  None ____________________________________________   INITIAL IMPRESSION / ASSESSMENT AND PLAN / ED COURSE  25 y.o. male history of cerebral palsy who presents for evaluation of nausea and vomiting.  Patient was seen here yesterday for abdominal pain, vomiting and diarrhea after returning from a trip from [place].  Had labs and imaging including a CT scan which showed gastroenteritis.  COVID and flu were negative.  Since gone home the diarrhea has resolved but patient continues to vomit even after receiving ODT Zofran.  He continues to complain of diffuse crampy abdominal pain.  Is afebrile, tachycardic and normotensive,  abdomen is soft with diffusely tenderness to palpation, no localized tenderness, rebound or guarding.  Presentation is consistent with a diagnosed receive yesterday a viral gastroenteritis.  Will give IV fluids, IV Zofran and Toradol and check basic labs.  _________________________ 4:02 AM on 03/08/2021 -----------------------------------------  Labs showing hypokalemia with no signs of AKI.  Patient be supplemented IV.  EKG with no signs of prolonged QTC.  After fluids and Zofran patient vomited yet again.  This time coffee-ground emesis.  Will start Protonix bolus and drip and get patient admitted to the hospitalist service for intractable nausea and vomiting.     _____________________________________________ Please note:  Patient was evaluated in Emergency Department today for the symptoms described in the history of present illness. Patient was evaluated in the context of the global COVID-19 pandemic, which necessitated consideration that the patient might be at risk for infection with the SARS-CoV-2 virus that causes COVID-19. Institutional protocols and algorithms that pertain to the evaluation of patients at risk for COVID-19 are in a state of rapid  change based on information released by regulatory bodies including the CDC and federal and state organizations. These policies and algorithms were followed during the patient's care in the ED.  Some ED evaluations and interventions may be delayed as a result of limited staffing during the pandemic.   [place] Controlled Substance Database was reviewed by me. ____________________________________________   FINAL CLINICAL IMPRESSION(S) / ED DIAGNOSES   Final diagnoses:  Gastroenteritis  Hematemesis with nausea  Intractable nausea and vomiting      NEW MEDICATIONS STARTED DURING THIS VISIT:  ED Discharge Orders    None       Note:  This document was prepared using Dragon voice recognition software and may include unintentional dictation  errors.    Don Perking, [place], MD 03/08/21 [phone]

## 2021-03-08 NOTE — ED Notes (Signed)
Pt refusing BP at this time, will check again when pt is rested

## 2021-03-08 NOTE — Op Note (Signed)
[facility] Gastroenterology Patient Name: Miguel GarterKevin Myers Procedure Date: 03/08/2021 12:42 PM MRN: [id number] Account #: [id number] Date of Birth: 09/03/1996 Admit Type: Inpatient Age: 25 Room: [facility] ENDO ROOM 1 Gender: Male Note Status: Finalized Procedure:             Upper GI endoscopy Indications:           Acute post hemorrhagic anemia, Coffee-ground emesis,                         Melena Providers:             Toney Reilohini Reddy Chigozie Basaldua MD, MD Referring MD:          No Local Md, MD (Referring MD) Medicines:             General Anesthesia Complications:         No immediate complications. Estimated blood loss: None. Procedure:             Pre-Anesthesia Assessment:                        - Prior to the procedure, a History and Physical was                         performed, and patient medications and allergies were                         reviewed. The patient is unable to give consent                         secondary to the patient being legally incompetent to                         consent. The risks and benefits of the procedure and                         the sedation options and risks were discussed with the                         patient's father. All questions were answered and                         informed consent was obtained. Patient identification                         and proposed procedure were verified by the physician,                         the nurse, the anesthesiologist, the anesthetist and                         the technician in the pre-procedure area in the                         procedure room in the endoscopy suite. Mental Status                         Examination: alert and oriented. Airway Examination:  normal oropharyngeal airway and neck mobility.                         Respiratory Examination: clear to auscultation. CV                         Examination: normal. Prophylactic Antibiotics: The                          patient does not require prophylactic antibiotics.                         Prior Anticoagulants: The patient has taken no                         previous anticoagulant or antiplatelet agents. ASA                         Grade Assessment: II - A patient with mild systemic                         disease. After reviewing the risks and benefits, the                         patient was deemed in satisfactory condition to                         undergo the procedure. The anesthesia plan was to use                         general anesthesia. Immediately prior to                         administration of medications, the patient was                         re-assessed for adequacy to receive sedatives. The                         heart rate, respiratory rate, oxygen saturations,                         blood pressure, adequacy of pulmonary ventilation, and                         response to care were monitored throughout the                         procedure. The physical status of the patient was                         re-assessed after the procedure.                        After obtaining informed consent, the endoscope was                         passed under direct vision. Throughout the procedure,  the patient's blood pressure, pulse, and oxygen                         saturations were monitored continuously. The Endoscope                         was introduced through the mouth, and advanced to the                         second part of duodenum. The upper GI endoscopy was                         accomplished without difficulty. The patient tolerated                         the procedure well. Findings:      The duodenal bulb and second portion of the duodenum were normal.      A medium-sized hiatal hernia was present.      LA Grade D (one or more mucosal breaks involving at least 75% of       esophageal circumference) esophagitis with no bleeding was  found in the       lower third of the esophagus.      Few superficial esophageal ulcers with no bleeding and no stigmata of       recent bleeding were found in the lower third of the esophagus. The       largest lesion was 5 mm in largest dimension.      Moderate amounts of Bilious fluid was found in the gastric fundus and in       the gastric body, suctioned. Otherwise, normal stomach Impression:            - Normal duodenal bulb and second portion of the                         duodenum.                        - Medium-sized hiatal hernia.                        - LA Grade D reflux, acute and erosive esophagitis                         with no bleeding.                        - Esophageal ulcers with no bleeding and no stigmata                         of recent bleeding.                        - Bilious gastric fluid.                        - No specimens collected. Recommendation:        - Return patient to hospital ward for ongoing care.                        - Resume  regular diet today.                        - Follow an antireflux regimen indefinitely.                        - Use Prilosec (omeprazole) 40 mg PO BID for 3 months                         then daily.                        - Return to my office in 3 months. Procedure Code(s):     --- Professional ---                        [id number], Esophagogastroduodenoscopy, flexible,                         transoral; diagnostic, including collection of                         specimen(s) by brushing or washing, when performed                         (separate procedure) Diagnosis Code(s):     --- Professional ---                        K44.9, Diaphragmatic hernia without obstruction or                         gangrene                        K21.00, Gastro-esophageal reflux disease with                         esophagitis, without bleeding                        K20.80, Other esophagitis without bleeding                         K22.10, Ulcer of esophagus without bleeding                        D62, Acute posthemorrhagic anemia                        K92.0, Hematemesis                        K92.1, Melena (includes Hematochezia) CPT copyright 2019 American Medical Association. All rights reserved. The codes documented in this report are preliminary and upon coder review may  be revised to meet current compliance requirements. Dr. Libby Maw Toney Reil MD, MD 03/08/2021 1:14:45 PM This report has been signed electronically. Number of Addenda: 0 Note Initiated On: 03/08/2021 12:42 PM Estimated Blood Loss:  Estimated blood loss: none.      [facility]

## 2021-03-11 ENCOUNTER — Telehealth: Payer: Self-pay

## 2021-03-11 ENCOUNTER — Encounter: Payer: Self-pay | Admitting: Gastroenterology

## 2021-03-11 NOTE — Telephone Encounter (Signed)
Called and left a message for call back  

## 2021-03-11 NOTE — Telephone Encounter (Signed)
-----   Message from Toney Reil, MD sent at 03/08/2021  1:55 PM EDT ----- Regarding: Hospital follow-up Please schedule follow-up visit in 2 to 3 months Dx: Erosive esophagitis, esophageal ulcers  RV

## 2021-03-13 ENCOUNTER — Encounter: Payer: Self-pay | Admitting: Gastroenterology

## 2021-03-13 ENCOUNTER — Ambulatory Visit (INDEPENDENT_AMBULATORY_CARE_PROVIDER_SITE_OTHER): Payer: Medicare Other | Admitting: Gastroenterology

## 2021-03-13 ENCOUNTER — Other Ambulatory Visit: Payer: Self-pay

## 2021-03-13 VITALS — BP 121/77 | HR 109 | Temp 98.1°F | Ht <= 58 in | Wt 125.0 lb

## 2021-03-13 DIAGNOSIS — K5909 Other constipation: Secondary | ICD-10-CM | POA: Diagnosis not present

## 2021-03-13 DIAGNOSIS — K2210 Ulcer of esophagus without bleeding: Secondary | ICD-10-CM | POA: Diagnosis not present

## 2021-03-13 DIAGNOSIS — R1013 Epigastric pain: Secondary | ICD-10-CM | POA: Diagnosis not present

## 2021-03-13 NOTE — Progress Notes (Signed)
Arlyss Repress, MD [address]  [address]  [place], [place] [address]  Main: [phone]  Fax: [phone]    Gastroenterology Consultation  Referring Provider:     Zenaida Myers* Primary Care Physician:  Miguel Niece, MD Primary Gastroenterologist:  Dr. Arlyss Repress Reason for Consultation:     Hospital follow-up, erosive esophagitis        HPI:   Miguel Vokes. is a 25 y.o. male referred by Dr. Rogelia Boga, Merlene Laughter, MD  for consultation & management of esophageal ulcers and erosive esophagitis.  Patient was recently admitted to [facility] within a week ago for epigastric pain and hematemesis.  He underwent upper endoscopy which revealed severe erosive esophagitis, medium size hiatal hernia and clean-based esophageal ulcers.  He was discharged home on omeprazole 40 mg twice daily before meals  Interval summary Patient is accompanied by his mom today. He reports compliant with omeprazole 40 mg twice daily.  However, he has been experiencing epigastric pain, worse after eating.  His diet is devoid of fiber, does consume high-calorie foods, red meat regularly.  He does not drink water.  He drinks sodas and fruit juices regularly.  He does have history of chronic constipation and takes suppository as needed only.  He does not have a bowel movement every day and his stools are hard. He denies any black stools, nausea or vomiting  NSAIDs: None  Antiplts/Anticoagulants/Anti thrombotics: None  GI Procedures:  Upper endoscopy 03/08/2021 - Normal duodenal bulb and second portion of the duodenum. - Medium-sized hiatal hernia. - LA Grade D reflux, acute and erosive esophagitis with no bleeding. - Esophageal ulcers with no bleeding and no stigmata of recent bleeding. - Bilious gastric fluid. - No specimens collected.  Past Medical History:  Diagnosis Date  . Cerebral palsy [facility])     Past Surgical History:  Procedure Laterality Date  .  ESOPHAGOGASTRODUODENOSCOPY (EGD) WITH PROPOFOL N/A 03/08/2021   Procedure: ESOPHAGOGASTRODUODENOSCOPY (EGD) WITH PROPOFOL;  Surgeon: Toney Reil, MD;  Location: [facility] ENDOSCOPY;  Service: Gastroenterology;  Laterality: N/A;    Current Outpatient Medications:  .  DULoxetine (CYMBALTA) 30 MG capsule, Take 60 mg by mouth daily., Disp: , Rfl:  .  omeprazole (PRILOSEC OTC) 20 MG tablet, Take 2 tablets (40 mg total) by mouth 2 (two) times daily., Disp: 360 tablet, Rfl: 0 .  traZODone (DESYREL) 50 MG tablet, Take 100 mg by mouth at bedtime., Disp: , Rfl:    History reviewed. No pertinent family history.   Social History   Tobacco Use  . Smoking status: Never Smoker  . Smokeless tobacco: Never Used  Substance Use Topics  . Alcohol use: No  . Drug use: No    Allergies as of 03/13/2021  . (No Known Allergies)    Review of Systems:    All systems reviewed and negative except where noted in HPI.   Physical Exam:  BP 121/77 (BP Location: Left Arm, Patient Position: Sitting, Cuff Size: Normal)   Pulse (!) 109   Temp 98.1 F (36.7 C) (Oral)   Ht 4\' 10"  (1.473 m)   Wt 125 lb (56.7 kg)   BMI 26.13 kg/m  No LMP for male patient.  General:   Alert,  Well-developed, well-nourished, pleasant and cooperative in NAD Head:  Normocephalic and atraumatic. Eyes:  Sclera clear, no icterus.   Conjunctiva pink. Ears:  Normal auditory acuity. Nose:  No deformity, discharge, or lesions. Mouth:  No deformity or lesions,oropharynx pink & moist.  Neck:  Supple; no masses or thyromegaly. Lungs:  Respirations even and unlabored.  Clear throughout to auscultation.   No wheezes, crackles, or rhonchi. No acute distress. Heart:  Regular rate and rhythm; no murmurs, clicks, rubs, or gallops. Abdomen:  Normal bowel sounds. Soft, mild epigastric tenderness and non-distended without masses, hepatosplenomegaly or hernias noted.  No guarding or rebound tenderness.   Rectal: Not performed Msk: Atrophy of his  hands and feet secondary to underlying cerebral palsy Pulses:  Normal pulses noted. Extremities:  No clubbing or edema.  No cyanosis. Neurologic:  Alert and oriented x3;  grossly normal neurologically. Skin:  Intact without significant lesions or rashes. No jaundice. Psych:  Alert and cooperative. Normal mood and affect.  Imaging Studies: Reviewed  Assessment and Plan:   Miguel Vivas. is a 25 y.o. pleasant male with history of cerebral palsy is seen as a hospital follow-up of hematemesis.  EGD revealed several clean-based esophageal ulcers, medium size hiatal hernia as well as erosive esophagitis.  Patient has ongoing epigastric pain despite being on optimal dose of PPI recommend to check H. pylori IgG and treat if positive Recommend repeat EGD in 2 months to confirm healing and rule out Barrett's Advised antireflux lifestyle, information provided  Chronic constipation Discussed about high-fiber diet and fiber supplements, information provided Start MiraLAX 1-2 times daily, samples provided Advised to avoid laxatives Advised to cut back on daily intake of carbonated beverages, fruit juices, red meat and high calorie foods   Follow up in 3 months   Arlyss Repress, MD

## 2021-03-19 ENCOUNTER — Telehealth: Payer: Self-pay | Admitting: Gastroenterology

## 2021-03-19 NOTE — Telephone Encounter (Signed)
[phone]-ENClaudette Myers from [facility] wanted Dr. Allegra Lai to know that she did a H-Polori test on this patient and the results were negative.

## 2021-05-06 ENCOUNTER — Encounter: Payer: Self-pay | Admitting: Gastroenterology

## 2021-05-07 ENCOUNTER — Encounter: Payer: Self-pay | Admitting: Gastroenterology

## 2021-05-07 ENCOUNTER — Ambulatory Visit: Payer: Medicare Other | Admitting: Anesthesiology

## 2021-05-07 ENCOUNTER — Ambulatory Visit
Admission: RE | Admit: 2021-05-07 | Discharge: 2021-05-07 | Disposition: A | Discharge status: Home / Self Care | Payer: Medicare Other | Attending: Gastroenterology | Admitting: Gastroenterology

## 2021-05-07 ENCOUNTER — Encounter
Admission: RE | Disposition: A | Discharge status: Home / Self Care | Payer: Self-pay | Source: Home / Self Care | Attending: Gastroenterology

## 2021-05-07 DIAGNOSIS — Z8661 Personal history of infections of the central nervous system: Secondary | ICD-10-CM | POA: Diagnosis not present

## 2021-05-07 DIAGNOSIS — K2100 Gastro-esophageal reflux disease with esophagitis, without bleeding: Secondary | ICD-10-CM | POA: Insufficient documentation

## 2021-05-07 DIAGNOSIS — K449 Diaphragmatic hernia without obstruction or gangrene: Secondary | ICD-10-CM | POA: Insufficient documentation

## 2021-05-07 DIAGNOSIS — K2210 Ulcer of esophagus without bleeding: Secondary | ICD-10-CM | POA: Diagnosis not present

## 2021-05-07 DIAGNOSIS — Z79899 Other long term (current) drug therapy: Secondary | ICD-10-CM | POA: Diagnosis not present

## 2021-05-07 DIAGNOSIS — G808 Other cerebral palsy: Secondary | ICD-10-CM | POA: Diagnosis not present

## 2021-05-07 HISTORY — PX: ESOPHAGOGASTRODUODENOSCOPY (EGD) WITH PROPOFOL: SHX5813

## 2021-05-07 SURGERY — ESOPHAGOGASTRODUODENOSCOPY (EGD) WITH PROPOFOL
Anesthesia: General

## 2021-05-07 MED ORDER — SODIUM CHLORIDE 0.9 % IV SOLN: INTRAVENOUS | Status: DC

## 2021-05-07 MED ORDER — GLYCOPYRROLATE 0.2 MG/ML IJ SOLN
INTRAMUSCULAR | Status: AC
  Filled 2021-05-07: qty 1

## 2021-05-07 MED ORDER — ONDANSETRON HCL 4 MG/2ML IJ SOLN: 4.0000 mg | Freq: Once | INTRAMUSCULAR | Status: DC

## 2021-05-07 MED ORDER — POTASSIUM CHLORIDE 20 MEQ PO PACK
40.0000 meq | PACK | Freq: Once | ORAL | Status: DC
  Filled 2021-05-07: qty 2

## 2021-05-07 MED ORDER — LIDOCAINE HCL (CARDIAC) PF 100 MG/5ML IV SOSY
PREFILLED_SYRINGE | INTRAVENOUS | Status: DC | PRN
  Administered 2021-05-07: 40 mg via INTRAVENOUS

## 2021-05-07 MED ORDER — LIDOCAINE HCL (PF) 1 % IJ SOLN
INTRAMUSCULAR | Status: AC
  Filled 2021-05-07: qty 2

## 2021-05-07 MED ORDER — PROPOFOL 500 MG/50ML IV EMUL
INTRAVENOUS | Status: DC | PRN
  Administered 2021-05-07: 150 ug/kg/min via INTRAVENOUS

## 2021-05-07 MED ORDER — ONDANSETRON HCL 4 MG/2ML IJ SOLN
INTRAMUSCULAR | Status: AC
  Administered 2021-05-07: 4 mg
  Filled 2021-05-07: qty 2

## 2021-05-07 MED ORDER — PHENYLEPHRINE HCL (PRESSORS) 10 MG/ML IV SOLN
INTRAVENOUS | Status: AC
  Filled 2021-05-07: qty 1

## 2021-05-07 MED ORDER — PROPOFOL 10 MG/ML IV BOLUS
INTRAVENOUS | Status: DC | PRN
  Administered 2021-05-07: 80 mg via INTRAVENOUS

## 2021-05-07 MED ORDER — PROPOFOL 500 MG/50ML IV EMUL
INTRAVENOUS | Status: AC
  Filled 2021-05-07: qty 50

## 2021-05-07 NOTE — Transfer of Care (Signed)
Immediate Anesthesia Transfer of Care Note  Patient: Miguel Myers.  Procedure(s) Performed: Procedure(s): ESOPHAGOGASTRODUODENOSCOPY (EGD) WITH PROPOFOL (N/A)  Patient Location: PACU and Endoscopy Unit  Anesthesia Type:General  Level of Consciousness: sedated  Airway & Oxygen Therapy: Patient Spontanous Breathing and Patient connected to nasal cannula oxygen  Post-op Assessment: Report given to RN and Post -op Vital signs reviewed and stable  Post vital signs: Reviewed and stable  Last Vitals:  Vitals:   05/07/21 1040 05/07/21 1049  BP: 97/70 105/63  Pulse: 97 97  Resp: 15 16  Temp: (!) 35.8 C   SpO2: 97% 97%    Complications: No apparent anesthesia complications

## 2021-05-07 NOTE — Anesthesia Postprocedure Evaluation (Signed)
Anesthesia Post Note  Patient: Shad Ledvina.  Procedure(s) Performed: ESOPHAGOGASTRODUODENOSCOPY (EGD) WITH PROPOFOL  Patient location during evaluation: Endoscopy Anesthesia Type: General Level of consciousness: awake and alert Pain management: pain level controlled Vital Signs Assessment: post-procedure vital signs reviewed and stable Respiratory status: spontaneous breathing, nonlabored ventilation, respiratory function stable and patient connected to nasal cannula oxygen Cardiovascular status: blood pressure returned to baseline and stable Postop Assessment: no apparent nausea or vomiting Anesthetic complications: no   No notable events documented.   Last Vitals:  Vitals:   05/07/21 1110 05/07/21 1120  BP: (!) 87/71 105/70  Pulse: 82 76  Resp: 20 19  Temp:    SpO2: 98% 97%    Last Pain:  Vitals:   05/07/21 1040  TempSrc: Temporal  PainSc:                  Cleda Mccreedy Geovanie Winnett

## 2021-05-07 NOTE — OR Nursing (Signed)
C/o nausea. Zofran adm.

## 2021-05-07 NOTE — Op Note (Signed)
[facility] Gastroenterology Patient Name: Miguel Myers Procedure Date: 05/07/2021 8:49 AM MRN: [id number] Account #: [id number] Date of Birth: [birth year] Admit Type: Outpatient Age: 25 Room: [facility] ENDO ROOM 2 Gender: Male Note Status: Finalized Procedure:             Upper GI endoscopy Indications:           Follow-up of reflux esophagitis Providers:             [name] MD, MD Medicines:             Monitored Anesthesia Care Complications:         No immediate complications. Procedure:             Pre-Anesthesia Assessment:                        - Prior to the procedure, a History and Physical was                         performed, and patient medications, allergies and                         sensitivities were reviewed. The patient's tolerance                         of previous anesthesia was reviewed.                        - The risks and benefits of the procedure and the                         sedation options and risks were discussed with the                         patient. All questions were answered and informed                         consent was obtained.                        - ASA Grade Assessment: III - A patient with severe                         systemic disease.                        After obtaining informed consent, the endoscope was                         passed under direct vision. Throughout the procedure,                         the patient's blood pressure, pulse, and oxygen                         saturations were monitored continuously. The Endoscope                         was introduced through the mouth, and advanced to the  jejunum. The upper GI endoscopy was accomplished with                         ease. The patient tolerated the procedure poorly due                         to the patient's respiratory instability                         (bronchospasm). Findings:      The stomach was normal.      A  medium-sized hiatal hernia was present.      The Z-line was irregular. Biopsies were taken with a cold forceps for       histology. Could not take multiple biopsies as he saturated to desaturate      There was evidence of a widely patent duodenum , there was some form of       loop surgery with preservation of the gastric antrum Impression:            - Normal stomach.                        - Medium-sized hiatal hernia.                        - Z-line irregular. Biopsied.                        - Widely patent, characterized by healthy appearing                         mucosa was found. Recommendation:        - Discharge patient to home (ambulatory).                        - Resume previous diet.                        - Continue present medications.                        - Await pathology results.                        - Return to GI clinic as previously scheduled. [name], MD [name] MD, MD 05/07/2021 10:46:14 AM This report has been signed electronically. Number of Addenda: 0 Note Initiated On: 05/07/2021 8:49 AM Estimated Blood Loss:  Estimated blood loss: none.      [facility]

## 2021-05-07 NOTE — Anesthesia Preprocedure Evaluation (Addendum)
Anesthesia Evaluation  Patient identified by MRN, date of birth, ID band Patient awake    Reviewed: Allergy & Precautions, NPO status , Patient's Chart, lab work & pertinent test results  History of Anesthesia Complications (+) DIFFICULT IV STICK / SPECIAL LINE and history of anesthetic complications  Airway Mallampati: III  TM Distance: <3 FB Neck ROM: full    Dental  (+) Chipped, Poor Dentition   Pulmonary neg pulmonary ROS, neg shortness of breath,    Pulmonary exam normal        Cardiovascular (-) anginanegative cardio ROS Normal cardiovascular exam     Neuro/Psych PSYCHIATRIC DISORDERS negative neurological ROS     GI/Hepatic Neg liver ROS, GERD  Medicated and Controlled,  Endo/Other  negative endocrine ROS  Renal/GU negative Renal ROS  negative genitourinary   Musculoskeletal   Abdominal   Peds  Hematology negative hematology ROS (+)   Anesthesia Other Findings Past Medical History: 01/25/2021: Acute midline thoracic back pain     Comment:  Last Assessment & Plan:  Formatting of this note might               be different from the original. Given the spinal               tenderness, will obtain x-ray Will increase his baclofen               to 3 times daily.  We will continue his twice daily but               sent in a 30-day supply for an additional dose in the               afternoon. We will do 30 days of Mobic 7.5 mg Discussed               doing heat and TENS unit Order for physical therapy               placed the other home No date: Cerebral palsy (HCC) 05/16/2015: Chalazion of left lower eyelid 02/01/2019: Costochondritis     Comment:  Last Assessment & Plan:  Formatting of this note might               be different from the original. HPI: - Developed chest               pain on 3/21 - Has been present more at night when he is               in bed - History of quadriplegic infantile cerebral palsy               - Pain will come and go  - Typically will last for 15               minutes - Pain is described as a sharp pain - Rated at an              8/10 - Has not felt pain like this before - Has not had               any che 03/08/2021: Hypokalemia 04/17/2020: Impacted stool in rectum [facility])     Comment:  Last Assessment & Plan:  Formatting of this note is               different from the original. Constipation is best managed  with a preventive approach. Pt given the following               instructions in the AVS:   To prevent constipation: 1.               Take Miralax (osmotic laxative, softens stool) - 17grams               mixed in a beverage (apple juice works well) once daily.               May increase dose and frequency until effective   2. Take              Senna (senn 09/25/2016: Meningitis  Past Surgical History: 03/08/2021: ESOPHAGOGASTRODUODENOSCOPY (EGD) WITH PROPOFOL; N/A     Comment:  Procedure: ESOPHAGOGASTRODUODENOSCOPY (EGD) WITH               PROPOFOL;  Surgeon: Toney Reil, MD;  Location:               [facility] ENDOSCOPY;  Service: Gastroenterology;  Laterality:               N/A;     Reproductive/Obstetrics negative OB ROS                            Anesthesia Physical Anesthesia Plan  ASA: 3  Anesthesia Plan: General   Post-op Pain Management:    Induction: Intravenous  PONV Risk Score and Plan: Propofol infusion and TIVA  Airway Management Planned: Natural Airway and Nasal Cannula  Additional Equipment:   Intra-op Plan:   Post-operative Plan:   Informed Consent: I have reviewed the patients History and Physical, chart, labs and discussed the procedure including the risks, benefits and alternatives for the proposed anesthesia with the patient or authorized representative who has indicated his/her understanding and acceptance.     Dental Advisory Given  Plan Discussed with: Anesthesiologist, CRNA and  Surgeon  Anesthesia Plan Comments: (Patient consented for risks of anesthesia including but not limited to:  - adverse reactions to medications - risk of airway placement if required - damage to eyes, teeth, lips or other oral mucosa - nerve damage due to positioning  - sore throat or hoarseness - Damage to heart, brain, nerves, lungs, other parts of body or loss of life  He voiced understanding.)       Anesthesia Quick Evaluation

## 2021-05-07 NOTE — H&P (Signed)
[name], MD [address], [address], [place], [place], [address] [address], [place], [place], [address] Phone: [phone]  Fax: [phone]  Primary Care Physician:  Zenaida Niece, MD   Pre-Procedure History & Physical: HPI:  Miguel Myers. is a 25 y.o. male is here for an endoscopy    Past Medical History:  Diagnosis Date   Acute midline thoracic back pain 01/25/2021   Last Assessment & Plan:  Formatting of this note might be different from the original. Given the spinal tenderness, will obtain x-ray Will increase his baclofen to 3 times daily.  We will continue his twice daily but sent in a 30-day supply for an additional dose in the afternoon. We will do 30 days of Mobic 7.5 mg Discussed doing heat and TENS unit Order for physical therapy placed the other home   Cerebral palsy (HCC)    Chalazion of left lower eyelid 05/16/2015   Costochondritis 02/01/2019   Last Assessment & Plan:  Formatting of this note might be different from the original. HPI: - Developed chest pain on 3/21 - Has been present more at night when he is in bed - History of quadriplegic infantile cerebral palsy - Pain will come and go  - Typically will last for 15 minutes - Pain is described as a sharp pain - Rated at an 8/10 - Has not felt pain like this before - Has not had any che   Hypokalemia 03/08/2021   Impacted stool in rectum (HCC) 04/17/2020   Last Assessment & Plan:  Formatting of this note is different from the original. Constipation is best managed with a preventive approach. Pt given the following instructions in the AVS:    To prevent constipation: 1. Take Miralax (osmotic laxative, softens stool) - 17grams mixed in a beverage (apple juice works well) once daily. May increase dose and frequency until effective   2. 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    Past Surgical History:  Procedure Laterality Date   ESOPHAGOGASTRODUODENOSCOPY (EGD) WITH PROPOFOL N/A 03/08/2021    Procedure: ESOPHAGOGASTRODUODENOSCOPY (EGD) WITH PROPOFOL;  Surgeon: Toney Reil, MD;  Location: [facility] ENDOSCOPY;  Service: Gastroenterology;  Laterality: N/A;    Prior to Admission medications   Medication Sig Start Date End Date Taking? Authorizing Provider  DULoxetine (CYMBALTA) 30 MG capsule Take 60 mg by mouth daily. 02/26/21  Yes [provider]  omeprazole (PRILOSEC OTC) 20 MG tablet Take 2 tablets (40 mg total) by mouth 2 (two) times daily. 03/08/21 03/08/22 Yes Lynn Ito, MD  traZODone (DESYREL) 50 MG tablet Take 100 mg by mouth at bedtime.   Yes [provider]    Allergies as of 03/13/2021   (No Known Allergies)    History reviewed. No pertinent family history.  Social History   Socioeconomic History   Marital status: Single    Spouse name: Not on file   Number of children: Not on file   Years of education: Not on file   Highest education level: Not on file  Occupational History   Not on file  Tobacco Use   Smoking status: Never   Smokeless tobacco: Never  Vaping Use   Vaping Use: Never used  Substance and Sexual Activity   Alcohol use: No   Drug use: No   Sexual activity: Never  Other Topics Concern   Not on file  Social History Narrative   Not on file   Social Determinants of Health  Financial Resource Strain: Not on file  Food Insecurity: Not on file  Transportation Needs: Not on file  Physical Activity: Not on file  Stress: Not on file  Social Connections: Not on file  Intimate Partner Violence: Not on file    Review of Systems: See HPI, otherwise negative ROS  Physical Exam: BP 109/72   Pulse 87   Temp (!) 96.5 F (35.8 C) (Temporal)   Resp 18   Ht 4\' 10"  (1.473 m)   Wt 56.7 kg   SpO2 99%   BMI 26.13 kg/m  General:   Alert,  pleasant and cooperative in NAD Head:  Normocephalic and atraumatic. Neck:  Supple; no masses or thyromegaly. Lungs:  Clear throughout to auscultation, normal respiratory effort.     Heart:  +S1, +S2, Regular rate and rhythm, No edema. Abdomen:  Soft, nontender and nondistended. Normal bowel sounds, without guarding, and without rebound.   Neurologic:  Alert and  oriented x1  Impression/Plan: . is here for an endoscopy  to be performed for  evaluation of evbaluate esophagitis    Risks, benefits, limitations, and alternatives regarding endoscopy have been reviewed with the patient.  Questions have been answered.  All parties agreeable.   Dalia Heading, MD  05/07/2021, 10:34 AM

## 2021-05-08 ENCOUNTER — Encounter: Payer: Self-pay | Admitting: Gastroenterology

## 2021-05-08 LAB — SURGICAL PATHOLOGY

## 2021-05-16 ENCOUNTER — Encounter: Payer: Self-pay | Admitting: Gastroenterology

## 2021-06-17 ENCOUNTER — Other Ambulatory Visit: Payer: Self-pay

## 2021-06-18 ENCOUNTER — Encounter: Payer: Self-pay | Admitting: Gastroenterology

## 2021-06-18 ENCOUNTER — Ambulatory Visit (INDEPENDENT_AMBULATORY_CARE_PROVIDER_SITE_OTHER): Payer: Medicare Other | Admitting: Gastroenterology

## 2021-06-18 VITALS — BP 115/74 | HR 121 | Temp 97.8°F

## 2021-06-18 DIAGNOSIS — R1013 Epigastric pain: Secondary | ICD-10-CM | POA: Diagnosis not present

## 2021-06-18 DIAGNOSIS — K2210 Ulcer of esophagus without bleeding: Secondary | ICD-10-CM | POA: Diagnosis not present

## 2021-06-18 NOTE — Progress Notes (Signed)
Miguel Repress, MD [address]  [address]  [place], [place] [address]  Main: [phone]  Fax: [phone]    Gastroenterology Consultation  Referring Provider:     Zenaida Niece* Primary Care Physician:  Zenaida Niece, MD Primary Gastroenterologist:  Dr. Arlyss Myers Reason for Consultation:     Hospital follow-up, erosive esophagitis        HPI:   Miguel Myers. is a 25 y.o. male referred by Dr. Rogelia Boga, Merlene Laughter, MD  for consultation & management of esophageal ulcers and erosive esophagitis.  Patient was recently admitted to [facility] within a week ago for epigastric pain and hematemesis.  He underwent upper endoscopy which revealed severe erosive esophagitis, medium size hiatal hernia and clean-based esophageal ulcers.  He was discharged home on omeprazole 40 mg twice daily before meals  Interval summary Patient is accompanied by his mom today. He reports compliant with omeprazole 40 mg twice daily.  However, he has been experiencing epigastric pain, worse after eating.  His diet is devoid of fiber, does consume high-calorie foods, red meat regularly.  He does not drink water.  He drinks sodas and fruit juices regularly.  He does have history of chronic constipation and takes suppository as needed only.  He does not have a bowel movement every day and his stools are hard. He denies any black stools, nausea or vomiting  Follow-up visit 06/18/2021 Patient is here for follow-up of epigastric pain and erosive esophagitis.  He underwent repeat EGD which confirmed healing of erosive esophagitis.  He denies any epigastric pain or upper GI symptoms.  He is currently taking omeprazole 40 mg twice daily.  Taking sucralfate as needed.  Patient is accompanied by his mom today.  His weight has been stable.  And appetite is intact.  NSAIDs: None  Antiplts/Anticoagulants/Anti thrombotics: None  GI Procedures:  Upper endoscopy 03/08/2021 - Normal duodenal bulb  and second portion of the duodenum. - Medium-sized hiatal hernia. - LA Grade D reflux, acute and erosive esophagitis with no bleeding. - Esophageal ulcers with no bleeding and no stigmata of recent bleeding. - Bilious gastric fluid. - No specimens collected.  EGD 05/07/21 - Normal stomach. - Medium-sized hiatal hernia. - Z-line irregular. Biopsied. - Widely patent, characterized by healthy appearing mucosa was found.  DIAGNOSIS:  A.  GEJ; COLD BIOPSY:  - REFLUX GASTROESOPHAGITIS.  - FOCAL PANCREATIC ACINAR CELL METAPLASIA.  - NEGATIVE FOR INTESTINAL METAPLASIA, DYSPLASIA, AND MALIGNANCY.   Past Medical History:  Diagnosis Date   Acute midline thoracic back pain 01/25/2021   Last Assessment & Plan:  Formatting of this note might be different from the original. Given the spinal tenderness, will obtain x-ray Will increase his baclofen to 3 times daily.  We will continue his twice daily but sent in a 30-day supply for an additional dose in the afternoon. We will do 30 days of Mobic 7.5 mg Discussed doing heat and TENS unit Order for physical therapy placed the other home   Cerebral palsy (HCC)    Chalazion of left lower eyelid 05/16/2015   Costochondritis 02/01/2019   Last Assessment & Plan:  Formatting of this note might be different from the original. HPI: - Developed chest pain on 3/21 - Has been present more at night when he is in bed - History of quadriplegic infantile cerebral palsy - Pain will come and go  - Typically will last for 15 minutes - Pain is described as a sharp pain -  Rated at an 8/10 - Has not felt pain like this before - Has not had any che   Hypokalemia 03/08/2021   Impacted stool in rectum [facility]) 04/17/2020   Last Assessment & Plan:  Formatting of this note is different from the original. Constipation is best managed with a preventive approach. Pt given the following instructions in the AVS:    To prevent constipation: 1. Take Miralax (osmotic laxative, softens stool) - 17grams  mixed in a beverage (apple juice works well) once daily. May increase dose and frequency until effective   2. 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    Past Surgical History:  Procedure Laterality Date   ESOPHAGOGASTRODUODENOSCOPY (EGD) WITH PROPOFOL N/A 03/08/2021   Procedure: ESOPHAGOGASTRODUODENOSCOPY (EGD) WITH PROPOFOL;  Surgeon: Toney Reil, MD;  Location: [facility] ENDOSCOPY;  Service: Gastroenterology;  Laterality: N/A;   ESOPHAGOGASTRODUODENOSCOPY (EGD) WITH PROPOFOL N/A 05/07/2021   Procedure: ESOPHAGOGASTRODUODENOSCOPY (EGD) WITH PROPOFOL;  Surgeon: [name], MD;  Location: [facility] ENDOSCOPY;  Service: Gastroenterology;  Laterality: N/A;    Current Outpatient Medications:    baclofen (LIORESAL) 10 MG tablet, TAKE 1 TABLET(10 MG) BY MOUTH DAILY, Disp: , Rfl:    bisacodyl (DULCOLAX) 10 MG suppository, Place rectally., Disp: , Rfl:    DULoxetine (CYMBALTA) 30 MG capsule, Take by mouth., Disp: , Rfl:    omeprazole (PRILOSEC) 40 MG capsule, Take 40 mg by mouth 2 (two) times daily., Disp: , Rfl:    polyethylene glycol powder (GLYCOLAX/MIRALAX) 17 GM/SCOOP powder, Take by mouth., Disp: , Rfl:    sucralfate (CARAFATE) 1 GM/10ML suspension, Take by mouth., Disp: , Rfl:    traZODone (DESYREL) 50 MG tablet, Take 100 mg by mouth at bedtime., Disp: , Rfl:    No family history on file.   Social History   Tobacco Use   Smoking status: Never   Smokeless tobacco: Never  Vaping Use   Vaping Use: Never used  Substance Use Topics   Alcohol use: No   Drug use: No    Allergies as of 06/18/2021   (No Known Allergies)    Review of Systems:    All systems reviewed and negative except where noted in HPI.   Physical Exam:  BP 115/74 (BP Location: Left Arm, Patient Position: Sitting, Cuff Size: Normal)   Pulse (!) 121   Temp 97.8 F (36.6 C) (Oral)  No LMP for male patient.  General:   Alert,  Well-developed, well-nourished, pleasant and cooperative in NAD Head:  Normocephalic  and atraumatic. Eyes:  Sclera clear, no icterus.   Conjunctiva pink. Ears:  Normal auditory acuity. Nose:  No deformity, discharge, or lesions. Mouth:  No deformity or lesions,oropharynx pink & moist. Neck:  Supple; no masses or thyromegaly. Lungs:  Respirations even and unlabored.  Clear throughout to auscultation.   No wheezes, crackles, or rhonchi. No acute distress. Heart:  Regular rate and rhythm; no murmurs, clicks, rubs, or gallops. Abdomen:  Normal bowel sounds. Soft, mild epigastric tenderness and non-distended without masses, hepatosplenomegaly or hernias noted.  No guarding or rebound tenderness.   Rectal: Not performed Msk: Atrophy of his hands and feet secondary to underlying cerebral palsy Pulses:  Normal pulses noted. Extremities:  No clubbing or edema.  No cyanosis. Neurologic:  Alert and oriented x3;  grossly normal neurologically. Skin:  Intact without significant lesions or rashes. No jaundice. Psych:  Alert and cooperative. Normal mood and affect.  Imaging Studies: Reviewed  Assessment and Plan:   Miguel Myers. is a 25  y.o. pleasant male with history of cerebral palsy is seen as a hospital follow-up of hematemesis.  EGD revealed several clean-based esophageal ulcers, medium size hiatal hernia as well as erosive esophagitis.  H. pylori IgG is negative.  Repeat EGD confirm healing of erosive esophagitis, has medium size hiatal hernia, no evidence of Barrett's esophagus Decrease omeprazole to 40 mg daily before breakfast Reiterated on antireflux lifestyle, information provided   Follow up as needed    Miguel Repress, MD

## 2021-06-22 ENCOUNTER — Encounter: Payer: Self-pay | Admitting: Emergency Medicine

## 2021-06-22 ENCOUNTER — Other Ambulatory Visit: Payer: Self-pay

## 2021-06-22 ENCOUNTER — Ambulatory Visit
Admission: EM | Admit: 2021-06-22 | Discharge: 2021-06-22 | Disposition: A | Discharge status: Home / Self Care | Payer: Medicare Other | Attending: Emergency Medicine | Admitting: Emergency Medicine

## 2021-06-22 DIAGNOSIS — R42 Dizziness and giddiness: Secondary | ICD-10-CM | POA: Diagnosis not present

## 2021-06-22 MED ORDER — MECLIZINE HCL 25 MG PO TABS: 25.0000 mg | ORAL_TABLET | Freq: Three times a day (TID) | ORAL | 0 refills | Status: AC | PRN

## 2021-06-22 NOTE — Discharge Instructions (Addendum)
Take the Meclizine 2-3 times a day to help with your dizziness.  Return for re-evaluation for increased dizziness, runny nose or nasal congestion, ear pain, or fever.

## 2021-06-22 NOTE — ED Triage Notes (Signed)
Patient c/o dizziness that started last night.  Patient denies any pain.  Patient reports some pressure behind his eyes.

## 2021-06-22 NOTE — ED Provider Notes (Signed)
[facility]    CSN: [id number] Arrival date & time: 06/22/21  1552      History   Chief Complaint Chief Complaint  Patient presents with   Dizziness    HPI Shaheim Mahar. is a 25 y.o. male.   HPI  25 year old male here for evaluation of dizziness.  Patient is a 25 year old quadriplegic here complaining of dizziness that started last night around 9 PM and he describes it as room spinning.  He is also complaining of some pressure behind his eyes and like his eyes feel heavy.  He denies runny nose nasal congestion, ear pain, ringing in his ears, blurry vision, double vision, nausea or vomiting, or changes to appetite.  He states that he has had this in the past but it did not last this long.  Past Medical History:  Diagnosis Date   Acid reflux 04/13/2012   Acute midline thoracic back pain 01/25/2021   Last Assessment & Plan:  Formatting of this note might be different from the original. Given the spinal tenderness, will obtain x-ray Will increase his baclofen to 3 times daily.  We will continue his twice daily but sent in a 30-day supply for an additional dose in the afternoon. We will do 30 days of Mobic 7.5 mg Discussed doing heat and TENS unit Order for physical therapy placed the other home   Cerebral palsy (HCC)    Chalazion of left lower eyelid 05/16/2015   Coffee ground emesis 03/08/2021   Costochondritis 02/01/2019   Last Assessment & Plan:  Formatting of this note might be different from the original. HPI: - Developed chest pain on 3/21 - Has been present more at night when he is in bed - History of quadriplegic infantile cerebral palsy - Pain will come and go  - Typically will last for 15 minutes - Pain is described as a sharp pain - Rated at an 8/10 - Has not felt pain like this before - Has not had any che   Hypokalemia 03/08/2021   Impacted stool in rectum (HCC) 04/17/2020   Last Assessment & Plan:  Formatting of this note is different from the original.  Constipation is best managed with a preventive approach. Pt given the following instructions in the AVS:    To prevent constipation: 1. Take Miralax (osmotic laxative, softens stool) - 17grams mixed in a beverage (apple juice works well) once daily. May increase dose and frequency until effective   2. 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   Myopia 06/13/2011   Constipation 05/05/2011    Past Surgical History:  Procedure Laterality Date   ESOPHAGOGASTRODUODENOSCOPY (EGD) WITH PROPOFOL N/A 03/08/2021   Procedure: ESOPHAGOGASTRODUODENOSCOPY (EGD) WITH PROPOFOL;  Surgeon: Toney Reil, MD;  Location: [facility] ENDOSCOPY;  Service: Gastroenterology;  Laterality: N/A;   ESOPHAGOGASTRODUODENOSCOPY (EGD) WITH PROPOFOL N/A 05/07/2021   Procedure: ESOPHAGOGASTRODUODENOSCOPY (EGD) WITH PROPOFOL;  Surgeon: [name], MD;  Location: [facility] ENDOSCOPY;  Service: Gastroenterology;  Laterality: N/A;       Home Medications    Prior to Admission medications   Medication Sig Start Date End Date Taking? Authorizing Provider  baclofen (LIORESAL)  10 MG tablet TAKE 1 TABLET(10 MG) BY MOUTH DAILY 05/21/21  Yes [provider]  DULoxetine (CYMBALTA) 30 MG capsule Take by mouth. 04/11/21 08/09/21 Yes [provider]  meclizine (ANTIVERT) 25 MG tablet Take 1 tablet (25 mg total) by mouth 3 (three) times daily as needed for dizziness. 06/22/21  Yes Becky Augusta, NP  omeprazole (PRILOSEC) 40 MG capsule Take 40 mg by mouth 2 (two) times daily. 03/08/21  Yes [provider]  traZODone (DESYREL) 50 MG tablet Take 100  mg by mouth at bedtime.   Yes [provider]  bisacodyl (DULCOLAX) 10 MG suppository Place rectally. 06/04/21   [provider]  polyethylene glycol powder (GLYCOLAX/MIRALAX) 17 GM/SCOOP powder Take by mouth. 03/15/21   [provider]  sucralfate (CARAFATE) 1 GM/10ML suspension Take by mouth. 03/27/21 03/27/22  [provider]    Family History History reviewed. No pertinent family history.  Social History Social History   Tobacco Use   Smoking status: Never   Smokeless tobacco: Never  Vaping Use   Vaping Use: Never used  Substance Use Topics   Alcohol use: No   Drug use: No     Allergies   Patient has no known allergies.   Review of Systems Review of Systems  Constitutional:  Negative for activity change, appetite change and fever.  HENT:  Negative for congestion, ear discharge, ear pain, rhinorrhea and tinnitus.   Eyes:  Negative for visual disturbance.  Gastrointestinal:  Negative for nausea and vomiting.  Neurological:  Positive for dizziness. Negative for facial asymmetry and headaches.    Physical Exam Triage Vital Signs ED Triage Vitals  Enc Vitals Group     BP 06/22/21 1601 (!) 148/85     Pulse Rate 06/22/21 1601 96     Resp 06/22/21 1601 16     Temp 06/22/21 1601 97.6 F (36.4 C)     Temp Source 06/22/21 1601 Oral     SpO2 06/22/21 1601 95 %     Weight 06/22/21 1557 125 lb (56.7 kg)     Height --      Head Circumference --      Peak Flow --      Pain Score 06/22/21 1557 0     Pain Loc --      Pain Edu? --      Excl. in GC? --    No data found.  Updated Vital Signs BP (!) 148/85 (BP Location: Right Arm)   Pulse 96   Temp 97.6 F (36.4 C) (Oral)   Resp 16   Wt 125 lb (56.7 kg)   SpO2 95%   BMI 26.13 kg/m   Visual Acuity Right Eye Distance:   Left Eye Distance:   Bilateral Distance:    Right Eye Near:   Left Eye Near:    Bilateral Near:     Physical Exam Vitals and nursing note reviewed.   Constitutional:      General: He is not in acute distress.    Appearance: Normal appearance. He is not ill-appearing.  HENT:     Head: Normocephalic and atraumatic.     Right Ear: Ear canal and external ear normal. There is no impacted cerumen.     Left Ear: Ear canal and external ear normal. There is no impacted cerumen.     Nose: Congestion present. No rhinorrhea.     Mouth/Throat:     Mouth: Mucous membranes are moist.     Pharynx: No posterior oropharyngeal  erythema.  Cardiovascular:     Rate and Rhythm: Normal rate and regular rhythm.     Pulses: Normal pulses.     Heart sounds: Normal heart sounds. No murmur heard.   No gallop.  Pulmonary:     Effort: Pulmonary effort is normal.     Breath sounds: Normal breath sounds. No wheezing, rhonchi or rales.  Musculoskeletal:     Cervical back: Normal range of motion and neck supple.  Lymphadenopathy:     Cervical: No cervical adenopathy.  Skin:    General: Skin is warm and dry.     Capillary Refill: Capillary refill takes less than 2 seconds.     Findings: No erythema or rash.  Neurological:     General: No focal deficit present.     Mental Status: He is alert and oriented to person, place, and time.  Psychiatric:        Mood and Affect: Mood normal.        Behavior: Behavior normal.        Thought Content: Thought content normal.        Judgment: Judgment normal.     UC Treatments / Results  Labs (all labs ordered are listed, but only abnormal results are displayed) Labs Reviewed - No data to display  EKG   Radiology No results found.  Procedures Procedures (including critical care time)  Medications Ordered in UC Medications - No data to display  Initial Impression / Assessment and Plan / UC Course  I have reviewed the triage vital signs and the nursing notes.  Pertinent labs & imaging results that were available during my care of the patient were reviewed by me and considered in my medical decision making  (see chart for details).  Is a pleasant, nontoxic-appearing 25 year old male with a history of quadriplegia who is here for cute onset of dizziness last night at 9 PM which she describes as room spinning and pressure behind his eyes.  He has had no other associated upper or lower respiratory symptoms.  He has had 1 episode of this in the past and last about 15 minutes and resolved but never to return.  Patient's physical exam reveals erythema to the rim of both tympanic membranes bilaterally.  External auditory canals are clear.  Nasal mucosa is erythematous without overt edema or discharge.  Oropharyngeal exam is benign.  No cervical lymphadenopathy appreciated on exam.  Cardiopulmonary exam reveals clear lung sounds in all fields.  With the lack of nystagmus or strabismus, visual changes, or nausea or vomiting I suspect that this is not vertigo related.  Given the erythema to the rim of both tympanic membranes and the inflamed nasal mucosa I suspect patient is developing an upper respiratory infection and may have middle ear fluid that is affecting his spatial sensation causing the dizziness.  Will conduct a trial of Antivert to 3 times a day to see if this helps his symptoms.  I advised the patient and his father that this may be a presenting symptom of a developing upper respiratory infection and asked him to please return should any new symptoms develop such as runny nose nasal congestion, fever, nasal discharge, ear pain, or any increase in the dizziness.   Final Clinical Impressions(s) / UC Diagnoses   Final diagnoses:  Dizziness     Discharge Instructions      Take the Meclizine 2-3 times a day to help with your dizziness.  Return for re-evaluation for increased dizziness, runny nose or  nasal congestion, ear pain, or fever.      ED Prescriptions     Medication Sig Dispense Auth. Provider   meclizine (ANTIVERT) 25 MG tablet Take 1 tablet (25 mg total) by mouth 3 (three) times daily as  needed for dizziness. 30 tablet Becky Augustayan, Milad Bublitz, NP      PDMP not reviewed this encounter.   Becky Augustayan, Manuel Dall, NP 06/22/21 1622

## 2021-09-18 ENCOUNTER — Emergency Department (HOSPITAL_COMMUNITY): Payer: Medicare Other

## 2021-09-18 ENCOUNTER — Emergency Department (HOSPITAL_COMMUNITY)
Admission: EM | Admit: 2021-09-18 | Discharge: 2021-09-18 | Disposition: A | Discharge status: Home / Self Care | Payer: Medicare Other | Attending: Emergency Medicine | Admitting: Emergency Medicine

## 2021-09-18 ENCOUNTER — Other Ambulatory Visit: Payer: Self-pay

## 2021-09-18 DIAGNOSIS — E876 Hypokalemia: Secondary | ICD-10-CM | POA: Insufficient documentation

## 2021-09-18 DIAGNOSIS — R059 Cough, unspecified: Secondary | ICD-10-CM | POA: Diagnosis not present

## 2021-09-18 DIAGNOSIS — Z20822 Contact with and (suspected) exposure to covid-19: Secondary | ICD-10-CM | POA: Insufficient documentation

## 2021-09-18 DIAGNOSIS — R079 Chest pain, unspecified: Secondary | ICD-10-CM | POA: Diagnosis present

## 2021-09-18 DIAGNOSIS — R0789 Other chest pain: Secondary | ICD-10-CM | POA: Insufficient documentation

## 2021-09-18 DIAGNOSIS — R051 Acute cough: Secondary | ICD-10-CM

## 2021-09-18 LAB — D-DIMER, QUANTITATIVE: D-Dimer, Quant: 0.6 ug/mL-FEU — ABNORMAL HIGH (ref 0.00–0.50)

## 2021-09-18 LAB — CBC WITH DIFFERENTIAL/PLATELET
Abs Immature Granulocytes: 0.06 10*3/uL (ref 0.00–0.07)
Basophils Absolute: 0 10*3/uL (ref 0.0–0.1)
Basophils Relative: 0 %
Eosinophils Absolute: 0.2 10*3/uL (ref 0.0–0.5)
Eosinophils Relative: 2 %
HCT: 45.9 % (ref 39.0–52.0)
Hemoglobin: 15.8 g/dL (ref 13.0–17.0)
Immature Granulocytes: 1 %
Lymphocytes Relative: 18 %
Lymphs Abs: 1.8 10*3/uL (ref 0.7–4.0)
MCH: 29.1 pg (ref 26.0–34.0)
MCHC: 34.4 g/dL (ref 30.0–36.0)
MCV: 84.5 fL (ref 80.0–100.0)
Monocytes Absolute: 1 10*3/uL (ref 0.1–1.0)
Monocytes Relative: 10 %
Neutro Abs: 6.9 10*3/uL (ref 1.7–7.7)
Neutrophils Relative %: 69 %
Platelets: 228 10*3/uL (ref 150–400)
RBC: 5.43 MIL/uL (ref 4.22–5.81)
RDW: 13.3 % (ref 11.5–15.5)
WBC: 9.9 10*3/uL (ref 4.0–10.5)
nRBC: 0 % (ref 0.0–0.2)

## 2021-09-18 LAB — COMPREHENSIVE METABOLIC PANEL
ALT: 21 U/L (ref 0–44)
AST: 22 U/L (ref 15–41)
Albumin: 3.6 g/dL (ref 3.5–5.0)
Alkaline Phosphatase: 120 U/L (ref 38–126)
Anion gap: 10 (ref 5–15)
BUN: 11 mg/dL (ref 6–20)
CO2: 24 mmol/L (ref 22–32)
Calcium: 9.1 mg/dL (ref 8.9–10.3)
Chloride: 104 mmol/L (ref 98–111)
Creatinine, Ser: 0.77 mg/dL (ref 0.61–1.24)
GFR, Estimated: >60 mL/min (ref >60)
Glucose, Bld: 103 mg/dL — ABNORMAL HIGH (ref 70–99)
Potassium: 2.9 mmol/L — ABNORMAL LOW (ref 3.5–5.1)
Sodium: 138 mmol/L (ref 135–145)
Total Bilirubin: 0.9 mg/dL (ref 0.3–1.2)
Total Protein: 7.5 g/dL (ref 6.5–8.1)

## 2021-09-18 LAB — RESP PANEL BY RT-PCR (FLU A&B, COVID) ARPGX2
Influenza A by PCR: NEGATIVE
Influenza B by PCR: NEGATIVE
SARS Coronavirus 2 by RT PCR: NEGATIVE

## 2021-09-18 LAB — TROPONIN I (HIGH SENSITIVITY)
Troponin I (High Sensitivity): <2 ng/L (ref <18)
Troponin I (High Sensitivity): <2 ng/L (ref <18)

## 2021-09-18 MED ORDER — DOXYCYCLINE HYCLATE 100 MG PO CAPS: 100.0000 mg | ORAL_CAPSULE | Freq: Two times a day (BID) | ORAL | 0 refills | Status: AC

## 2021-09-18 MED ORDER — SODIUM CHLORIDE 0.9 % IV SOLN
100.0000 mg | Freq: Once | INTRAVENOUS | Status: AC
  Administered 2021-09-18: 100 mg via INTRAVENOUS
  Filled 2021-09-18: qty 100

## 2021-09-18 MED ORDER — POTASSIUM CHLORIDE CRYS ER 20 MEQ PO TBCR
40.0000 meq | EXTENDED_RELEASE_TABLET | Freq: Once | ORAL | Status: AC
  Administered 2021-09-18: 40 meq via ORAL
  Filled 2021-09-18: qty 2

## 2021-09-18 MED ORDER — LACTATED RINGERS IV BOLUS
500.0000 mL | Freq: Once | INTRAVENOUS | Status: AC
  Administered 2021-09-18: 500 mL via INTRAVENOUS

## 2021-09-18 MED ORDER — POTASSIUM CHLORIDE CRYS ER 20 MEQ PO TBCR: 40.0000 meq | EXTENDED_RELEASE_TABLET | Freq: Every day | ORAL | 0 refills | Status: AC

## 2021-09-18 MED ORDER — IOHEXOL 300 MG/ML  SOLN
100.0000 mL | Freq: Once | INTRAMUSCULAR | Status: AC | PRN
  Administered 2021-09-18: 100 mL via INTRAVENOUS

## 2021-09-18 MED ORDER — ERYTHROMYCIN 5 MG/GM OP OINT: TOPICAL_OINTMENT | OPHTHALMIC | 0 refills | Status: AC

## 2021-09-18 MED ORDER — PANTOPRAZOLE SODIUM 20 MG PO TBEC: 20.0000 mg | DELAYED_RELEASE_TABLET | Freq: Every day | ORAL | 0 refills | Status: DC

## 2021-09-18 NOTE — Discharge Instructions (Addendum)
You are seen in the emergency room today with chest discomfort and cough.  I am starting you on some antibiotics.  Potassium is slightly low.  We will replace his over the neck several days.  Please follow close with your primary care doctor and return with any new or suddenly worsening symptoms.

## 2021-09-18 NOTE — ED Provider Notes (Signed)
Emergency Department Provider Note   I have reviewed the triage vital signs and the nursing notes.   HISTORY  Chief Complaint Chest Pain   HPI Miguel Myers. is a 25 y.o. male with past history reviewed below including CP w/ quadriplegia presents emergency department with chest pain for the past 3 days.  He is not having shortness of breath or fever.  Has been feeling nasal congestion and cough.  His chest pain is mainly with coughing and is not having significant discomfort at rest.  He notes that multiple people at home are sick with the flu.  His dad, at bedside, notes some eye redness and crusting discharge bilaterally.  Patient is not having abdominal pain, vomiting, diarrhea.  He is taking in fluids without difficulty.  No radiation of symptoms or other modifying factors.   Past Medical History:  Diagnosis Date   Acid reflux 04/13/2012   Acute midline thoracic back pain 01/25/2021   Last Assessment & Plan:  Formatting of this note might be different from the original. Given the spinal tenderness, will obtain x-ray Will increase his baclofen to 3 times daily.  We will continue his twice daily but sent in a 30-day supply for an additional dose in the afternoon. We will do 30 days of Mobic 7.5 mg Discussed doing heat and TENS unit Order for physical therapy placed the other home   Cerebral palsy (HCC)    Chalazion of left lower eyelid 05/16/2015   Coffee ground emesis 03/08/2021   Costochondritis 02/01/2019   Last Assessment & Plan:  Formatting of this note might be different from the original. HPI: - Developed chest pain on 3/21 - Has been present more at night when he is in bed - History of quadriplegic infantile cerebral palsy - Pain will come and go  - Typically will last for 15 minutes - Pain is described as a sharp pain - Rated at an 8/10 - Has not felt pain like this before - Has not had any che   Hypokalemia 03/08/2021   Impacted stool in rectum (HCC) 04/17/2020   Last Assessment  & Plan:  Formatting of this note is different from the original. Constipation is best managed with a preventive approach. Pt given the following instructions in the AVS:    To prevent constipation: 1. Take Miralax (osmotic laxative, softens stool) - 17grams mixed in a beverage (apple juice works well) once daily. May increase dose and frequency until effective   2. 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   Myopia 06/13/2011   Constipation 05/05/2011    Past Surgical History:  Procedure Laterality Date   ESOPHAGOGASTRODUODENOSCOPY (EGD) WITH PROPOFOL N/A 03/08/2021   Procedure: ESOPHAGOGASTRODUODENOSCOPY (EGD) WITH PROPOFOL;  Surgeon: Toney Reil, MD;  Location: [facility] ENDOSCOPY;  Service: Gastroenterology;  Laterality: N/A;   ESOPHAGOGASTRODUODENOSCOPY (EGD) WITH PROPOFOL N/A 05/07/2021   Procedure: ESOPHAGOGASTRODUODENOSCOPY (EGD) WITH PROPOFOL;  Surgeon: [name], MD;  Location: [facility] ENDOSCOPY;  Service: Gastroenterology;  Laterality: N/A;    Allergies Patient has no known allergies.  No family history on file.  Social History Social History   Tobacco Use  Smoking status: Never   Smokeless tobacco: Never  Vaping Use   Vaping Use: Never used  Substance Use Topics   Alcohol use: No   Drug use: No    Review of Systems  Constitutional: No fever/chills Eyes: No visual changes. ENT: No sore throat. Positive congestion.  Cardiovascular: Positive CP with cough.  Respiratory: Denies shortness of breath. Positive cough.  Gastrointestinal: No abdominal pain.  No nausea, no vomiting.  No diarrhea.   No constipation. Genitourinary: Negative for dysuria. Musculoskeletal: Negative for back pain. Skin: Negative for rash. Neurological: Negative for headaches, focal weakness or numbness.  10-point ROS otherwise negative.  ____________________________________________   PHYSICAL EXAM:  VITAL SIGNS: ED Triage Vitals  Enc Vitals Group     BP 09/18/21 0420 115/70     Pulse Rate 09/18/21 0420 (!) 121     Resp 09/18/21 0420 16     Temp 09/18/21 0420 98.2 F (36.8 C)     Temp Source 09/18/21 0420 Oral     SpO2 09/18/21 0420 100 %     Weight 09/18/21 0417 123 lb 7.3 oz (56 kg)     Height 09/18/21 0417 4\' 10"  (1.473 m)   Constitutional: Alert and oriented. Well appearing and in no acute distress. Eyes: Conjunctivae are mildly injected bilaterally with crusting discharge.  Head: Atraumatic. Nose: Positive congestion/rhinnorhea. Mouth/Throat: Mucous membranes are moist.   Neck: No stridor.  Cardiovascular: Tachycardia. Good peripheral circulation. Grossly normal heart sounds.   Respiratory: Normal respiratory effort.  No retractions. Lungs CTAB. Gastrointestinal: Soft and nontender. No distention.  Musculoskeletal: No lower extremity tenderness nor edema. No gross deformities of extremities. Neurologic:  Normal speech and language. Quadriplegia noted and baseline.  Skin:  Skin is warm.   ____________________________________________   LABS (all labs ordered are listed, but only abnormal results are displayed)  Labs Reviewed  COMPREHENSIVE METABOLIC PANEL - Abnormal; Notable for the following components:      Result Value   Potassium 2.9 (*)    Glucose, Bld 103 (*)    All other components within normal limits  D-DIMER, QUANTITATIVE - Abnormal; Notable for the following components:   D-Dimer, Quant 0.60 (*)    All other components within normal limits  RESP PANEL BY RT-PCR (FLU A&B, COVID) ARPGX2  CBC WITH DIFFERENTIAL/PLATELET  TROPONIN I (HIGH SENSITIVITY)  TROPONIN I (HIGH  SENSITIVITY)   ____________________________________________  EKG   EKG Interpretation  Date/Time:  Wednesday September 18 2021 04:21:32 EST Ventricular Rate:  123 PR Interval:  120 QRS Duration: 78 QT Interval:  343 QTC Calculation: 491 R Axis:   95 Text Interpretation: Age not entered, assumed to be  25 years old for purpose of ECG interpretation Sinus tachycardia Consider RVH or posterior infarct Borderline prolonged QT interval Confirmed by [phone]) on 09/18/2021 4:28:58 AM        ____________________________________________  RADIOLOGY  CXR reviewed   ____________________________________________   PROCEDURES  Procedure(s) performed:   Procedures  None ____________________________________________   INITIAL IMPRESSION / ASSESSMENT AND PLAN / ED COURSE  Pertinent labs & imaging results that were available during my care of the patient were reviewed by me and considered in my medical decision making (see chart for details).   Patient presents emergency department with flulike symptoms and known flu exposure.  He is having chest pain although mainly with coughing.  No pleuritic pain.  He does have tachycardia on arrival but is afebrile.  Normal blood pressure.  He appears well-hydrated.  In review  of prior records he frequently has mild tachycardia and appears to be at baseline here.  Chest x-ray shows pneumonitis versus atelectasis versus developing infiltrate.  Suspect flu clinically with close family contacts also with flu.  His conjunctivitis is likely viral.  Will obtain blood work given his med history and reassess.   Labs are unremarkable. Care transferred to Dr. Dalene Seltzer.  ____________________________________________  FINAL CLINICAL IMPRESSION(S) / ED DIAGNOSES  Final diagnoses:  Atypical chest pain  Acute cough  Hypokalemia     MEDICATIONS GIVEN DURING THIS VISIT:  Medications  doxycycline (VIBRAMYCIN) 100 mg in sodium chloride 0.9 %  250 mL IVPB (0 mg Intravenous Stopped 09/18/21 0824)  lactated ringers bolus 500 mL (0 mLs Intravenous Stopped 09/18/21 0824)  potassium chloride SA (KLOR-CON) CR tablet 40 mEq (40 mEq Oral Given 09/18/21 6962)  iohexol (OMNIPAQUE) 300 MG/ML solution 100 mL (100 mLs Intravenous Contrast Given 09/18/21 0953)     NEW OUTPATIENT MEDICATIONS STARTED DURING THIS VISIT:  Discharge Medication List as of 09/18/2021 10:24 AM     START taking these medications   Details  doxycycline (VIBRAMYCIN) 100 MG capsule Take 1 capsule (100 mg total) by mouth 2 (two) times daily for 7 days., Starting Wed 09/18/2021, Until Wed 09/25/2021, Normal    erythromycin ophthalmic ointment Place a 1/2 inch ribbon of ointment into the lower eyelid., Normal    potassium chloride SA (KLOR-CON) 20 MEQ tablet Take 2 tablets (40 mEq total) by mouth daily for 4 days., Starting Thu 09/19/2021, Until Mon 09/23/2021, Normal        Note:  This document was prepared using Dragon voice recognition software and may include unintentional dictation errors.  Alona Bene, MD, [facility] Emergency Medicine    Tahjir Silveria, Arlyss Repress, MD 09/21/21 1341

## 2021-09-18 NOTE — ED Triage Notes (Addendum)
Pt c/o chest pain x 3 days. Pt denies shortness of breath. Pt states it hurts when he coughs.

## 2021-09-18 NOTE — ED Notes (Signed)
Hooked patient back up to the monitor patient is resting with family at bedside and call bell in reach 

## 2021-09-18 NOTE — ED Provider Notes (Signed)
  Physical Exam  BP 94/65   Pulse (!) 102   Temp 98.2 F (36.8 C) (Oral)   Resp 20   Ht 4\' 10"  (1.473 m)   Wt 56 kg   SpO2 100%   BMI 25.80 kg/m   Physical Exam  ED Course/Procedures     Procedures  MDM    Received care of patient at 7 AM from Dr. .  Please see his note for prior history, physical and care.  Briefly this is a 25 year old male with a history of paraplegia who presents with concern for chest pain with cough.  Chest x-ray with findings consistent with possible pneumonitis versus developing pneumonia.  COVID and influenza testing are negative today.  Delta troponin is pending.  Second troponin is negative and have low suspicion for ACS.  Added on D-dimer given history of paraplegia, pleuritic symptoms and tachycardia (although noted chronic) which is positive at 0.6.  CT PE study is ordered and pending.   PE study negative.  Has conjunctivitis,recommend compresses, can use erythromycin in case bacterial component. Patient discharged in stable condition with understanding of reasons to return.      30, MD 09/19/21 [phone]

## 2022-01-31 IMAGING — CT CT ABD-PELV W/O CM
2 of 4 series · 16 of 46 positions shown, 18 images · non-contrast
Comparison: 12/13/2019

CLINICAL DATA: Nausea and vomiting with abdominal pain

EXAM:
CT ABDOMEN AND PELVIS WITHOUT CONTRAST
TECHNIQUE: Multidetector CT imaging of the abdomen and pelvis was performed
following the standard protocol without IV contrast.

[Series 2: routine abd/pel wo · axial · 0.71mm/px · z∈[-411,-26]mm · 13 of 85 slices shown, 15 images]
[im 4/85  soft-tissue]
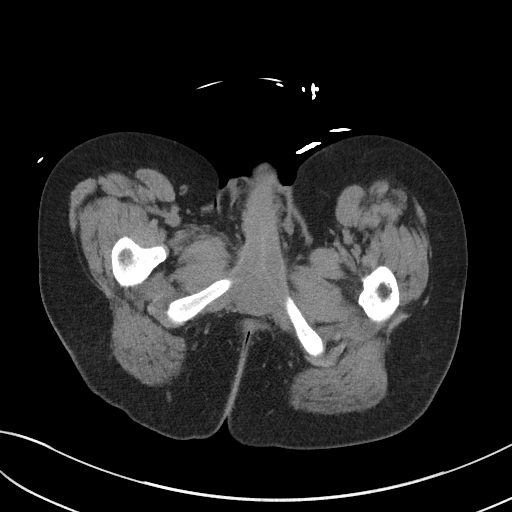
[im 4/85  bone]
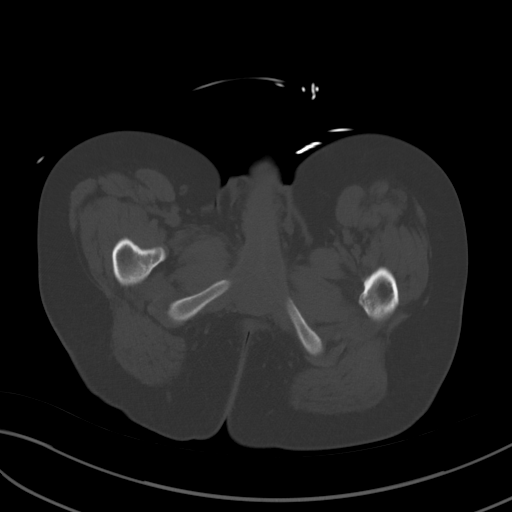
[im 11/85  soft-tissue]
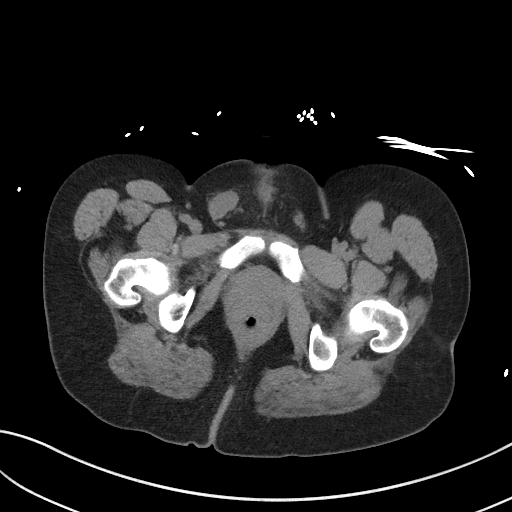
[im 18/85  soft-tissue]
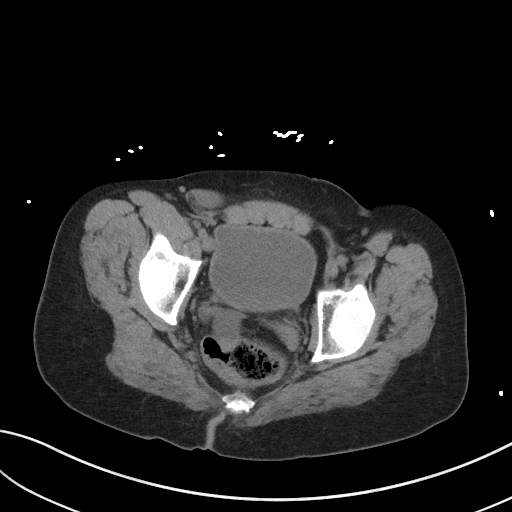
[im 25/85  soft-tissue]
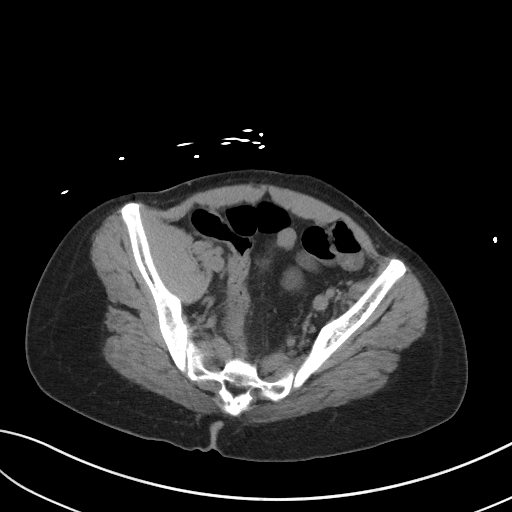
[im 29/85  soft-tissue]
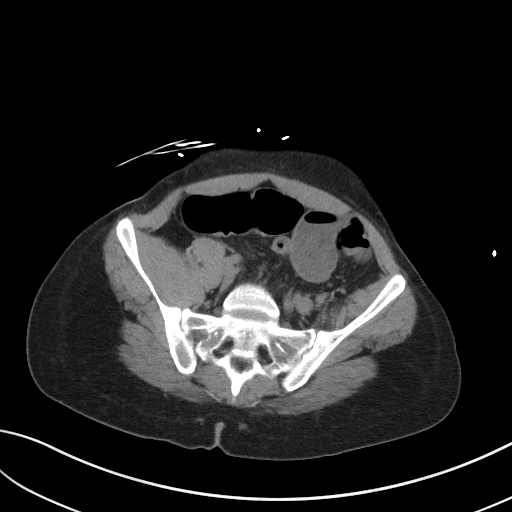
[im 36/85  soft-tissue]
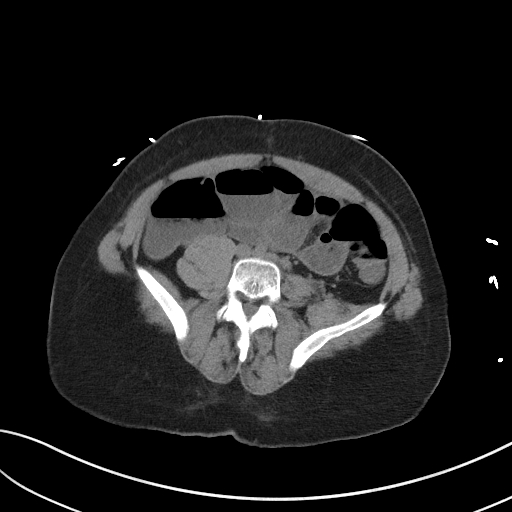
[im 43/85  soft-tissue]
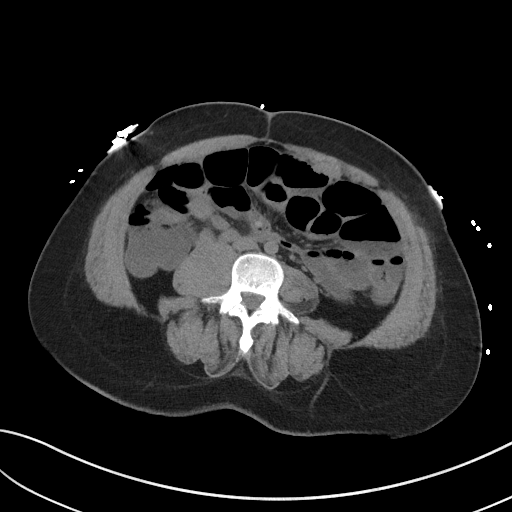
[im 50/85  soft-tissue]
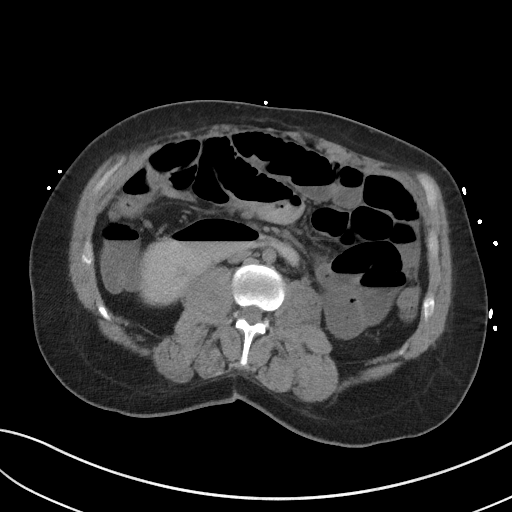
[im 57/85  soft-tissue]
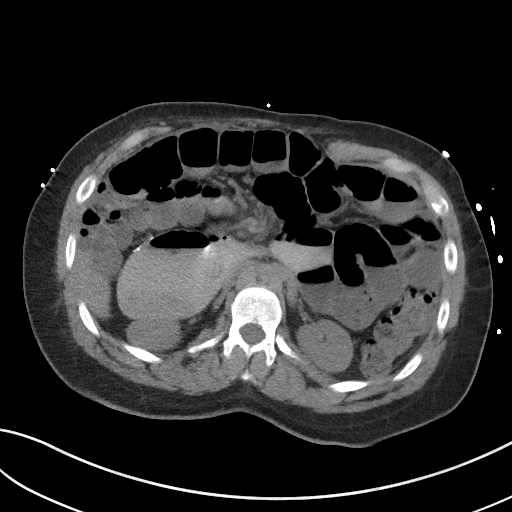
[im 57/85  bone]
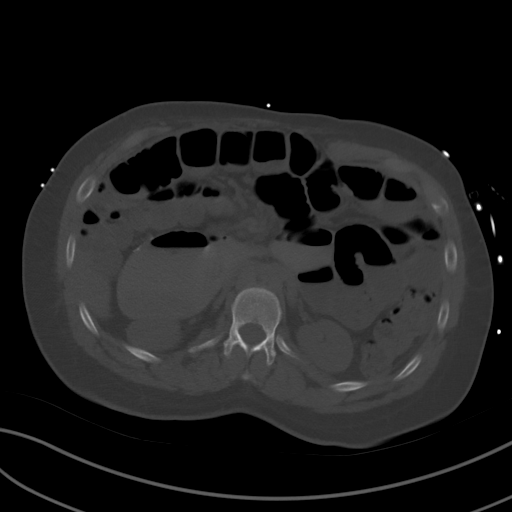
[im 60/85  soft-tissue]
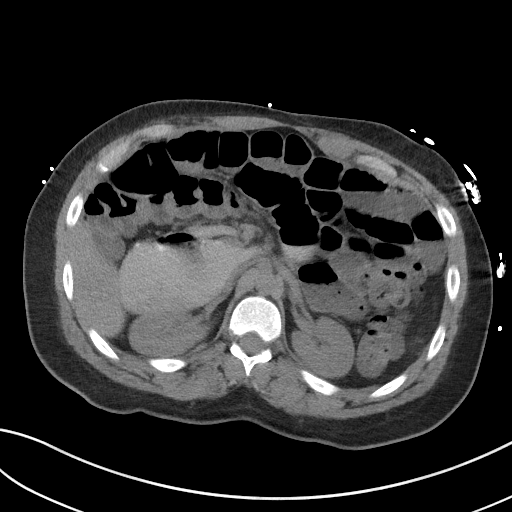
[im 67/85  soft-tissue]
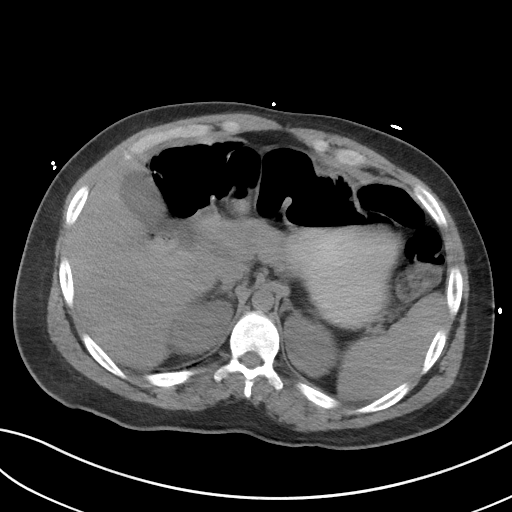
[im 74/85  soft-tissue]
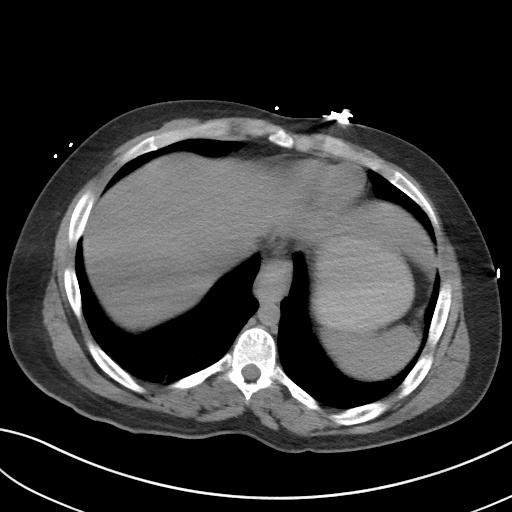
[im 81/85  soft-tissue]
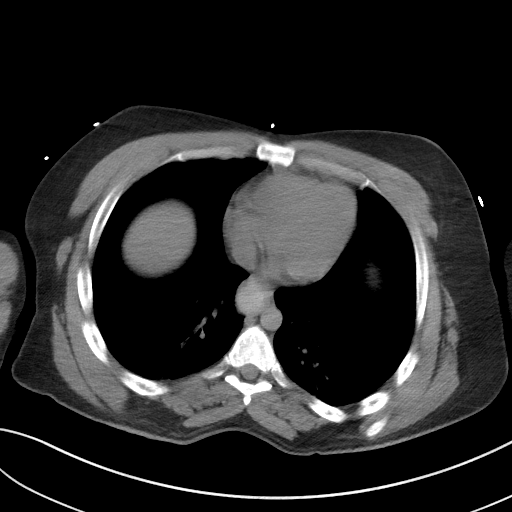

[Series 5: coronal st · coronal · 0.68mm/px · 3 of 86 slices shown]
[im 29/86  soft-tissue]
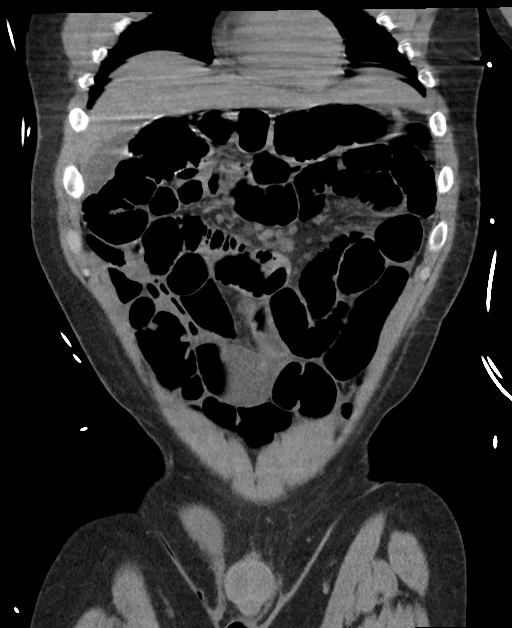
[im 38/86  soft-tissue]
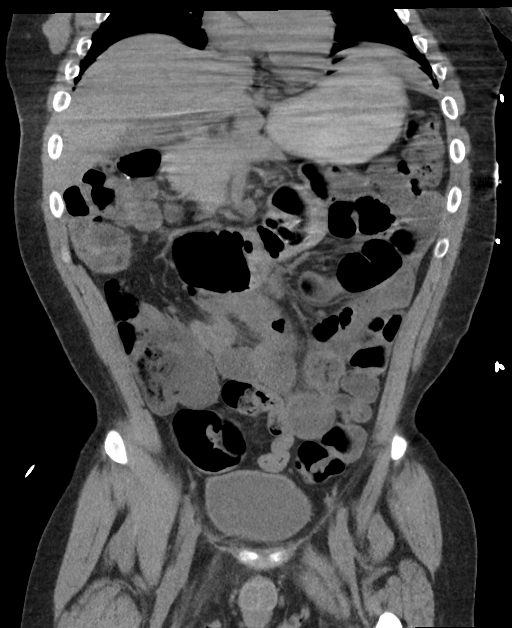
[im 48/86  soft-tissue]
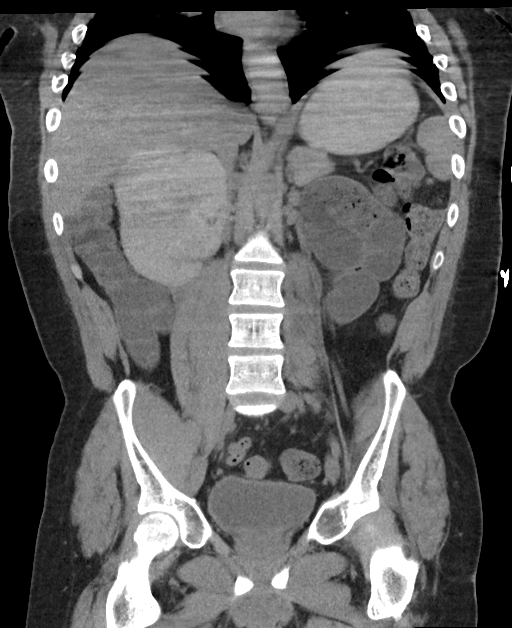

[16 of 46 positions shown; findings below may reference images not displayed]

FINDINGS: Lower chest: Mild dependent atelectasis or scarring. Contrast
distends the lower esophagus, likely related to history of vomiting.

Hepatobiliary: No focal liver abnormality.No evidence of biliary
obstruction or stone.

Pancreas: Unremarkable.

Spleen: Unremarkable.

Adrenals/Urinary Tract: Negative adrenals. No hydronephrosis or
ureteral stone. Small right renal calculus. Unremarkable bladder.

Stomach/Bowel: Enteric contrast moderately distends the stomach and
proximal duodenum. The duodenum is patulous with underlying
postoperative changes including sutures. There is a chronic
transition to collapsed duodenum at the level of the aortic
crossing, but not causing a acute obstruction. No volvulus. There is
diffuse fluid levels within small bowel. Fluid levels are also seen
in the colon to the level of the transverse segment. There are a few
distal colonic diverticula.

Vascular/Lymphatic: No acute vascular abnormality. No mass or
adenopathy.

Reproductive:The right testicle is at the right inguinal canal, also
seen on prior.

Other: No ascites or pneumoperitoneum.

Musculoskeletal: No acute abnormalities.
IMPRESSION: 1. Colonic and small bowel fluid levels suggesting
gastroenteritis/ileus.
2. Postoperative proximal duodenum which is chronically dilated.
3. The right testicle is at the inguinal canal, also seen in 9695
and 0751, presumably undescended. Recommend urology follow-up.
4. Small right renal calculus.

## 2022-08-14 IMAGING — CT CT ANGIO CHEST
2 of 6 series · 19 of 36 positions shown · IV contrast (omnipaque)
Comparison: None.

CLINICAL DATA: Shortness of breath

EXAM:
CT ANGIOGRAPHY CHEST WITH CONTRAST
TECHNIQUE: Multidetector CT imaging of the chest was performed using the
standard protocol during bolus administration of intravenous
contrast. Multiplanar CT image reconstructions and MIPs were
obtained to evaluate the vascular anatomy.
CONTRAST:  100mL OMNIPAQUE IOHEXOL 300 MG/ML  SOLN

[Series 7: pe thins · axial · 0.76mm/px · z∈[+988,+1265]mm · 18 of 442 slices shown]
[im 23/442  lung]
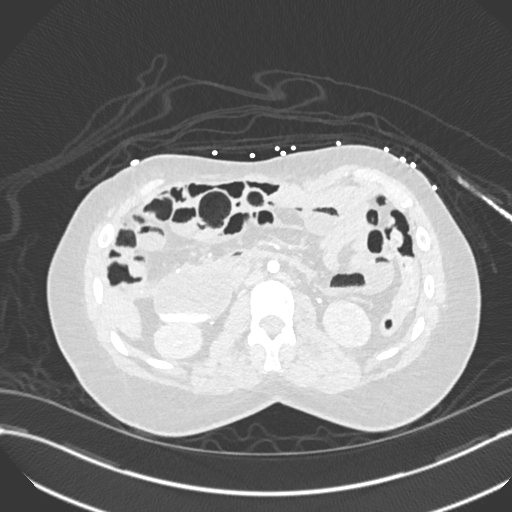
[im 45/442  mediastinal]
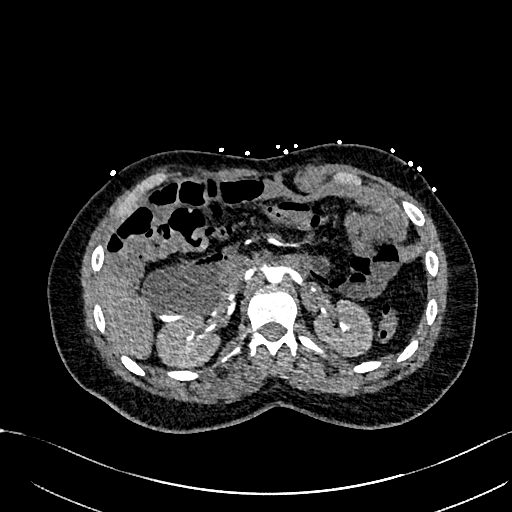
[im 67/442  lung]
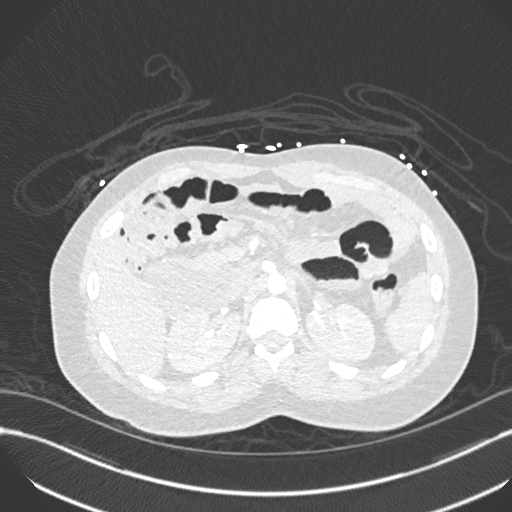
[im 89/442  mediastinal]
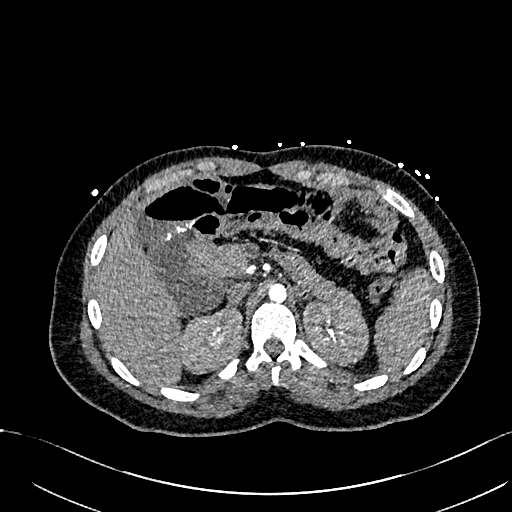
[im 111/442  lung]
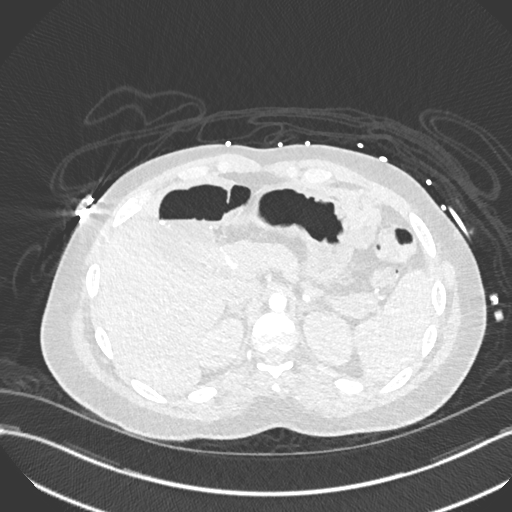
[im 133/442  mediastinal]
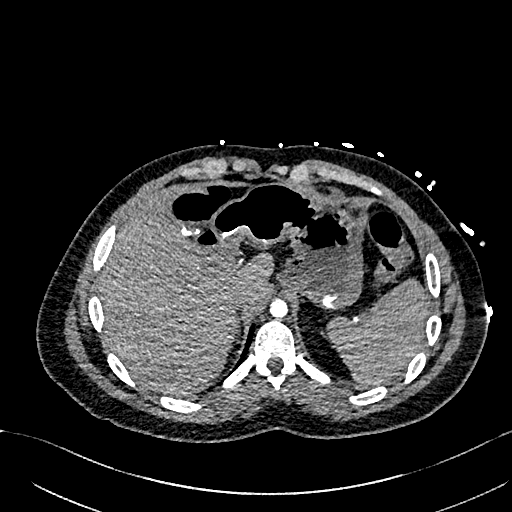
[im 155/442  lung]
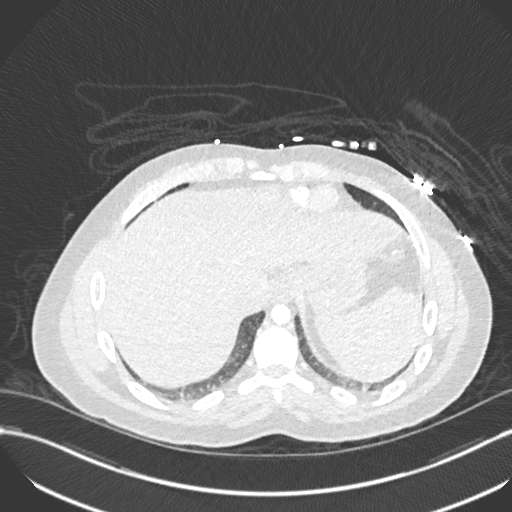
[im 177/442  mediastinal]
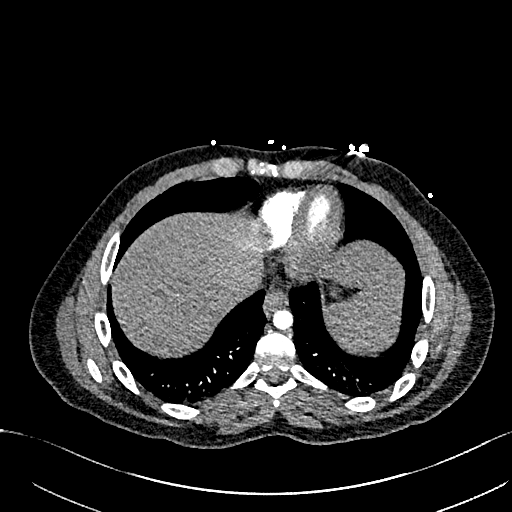
[im 199/442  lung]
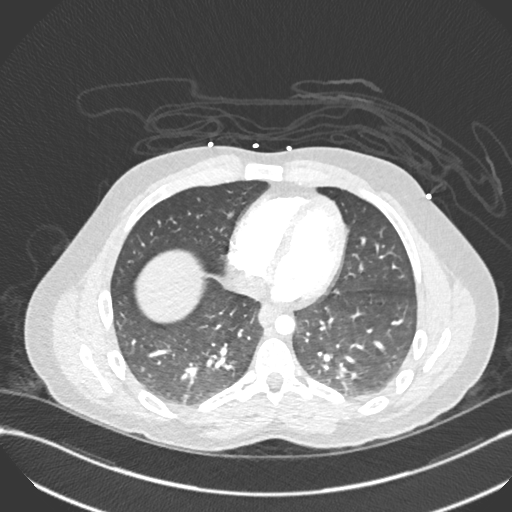
[im 243/442  mediastinal]
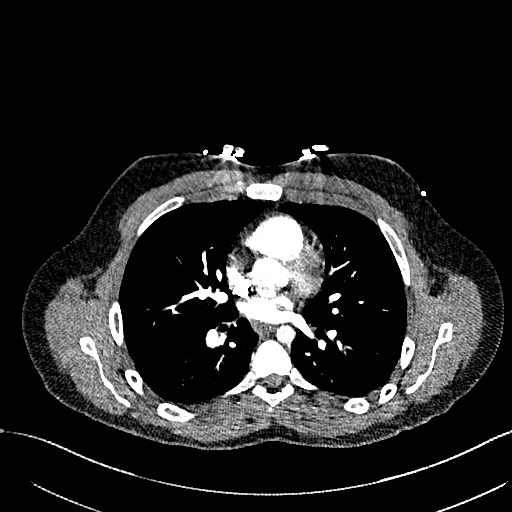
[im 265/442  lung]
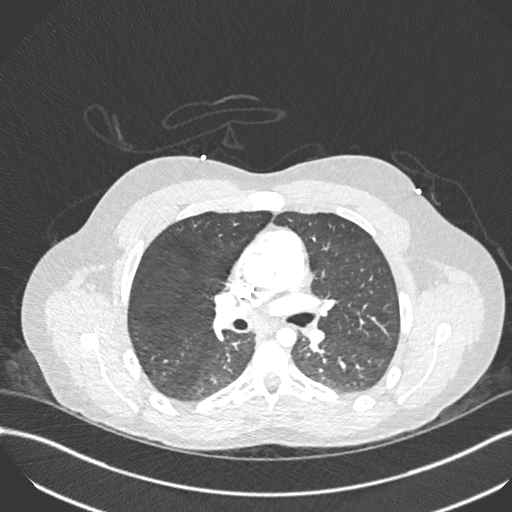
[im 287/442  mediastinal]
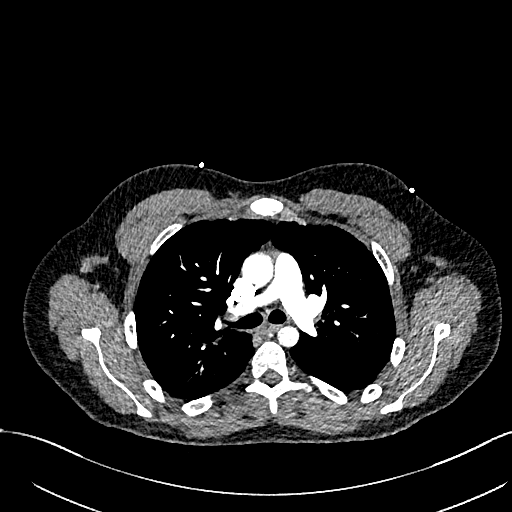
[im 309/442  lung]
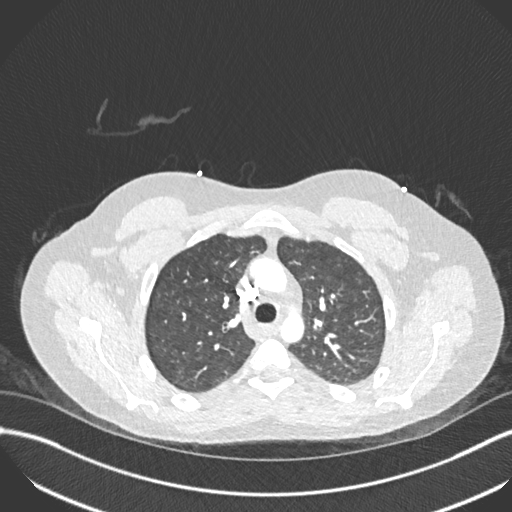
[im 331/442  mediastinal]
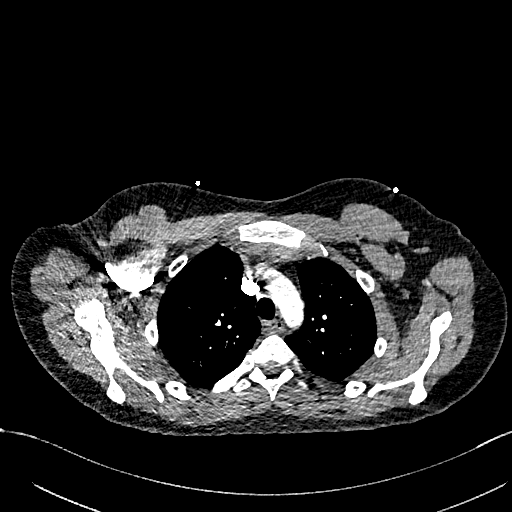
[im 353/442  lung]
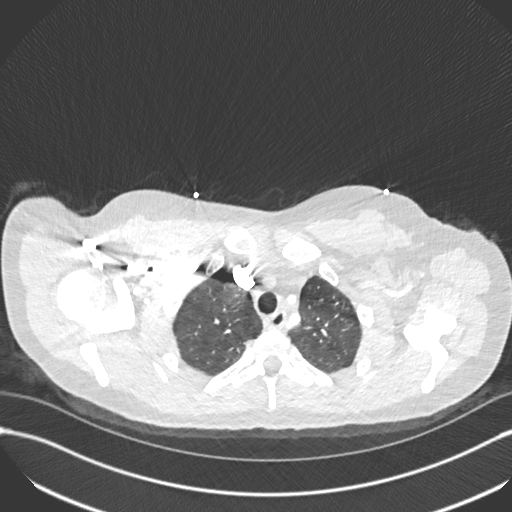
[im 375/442  mediastinal]
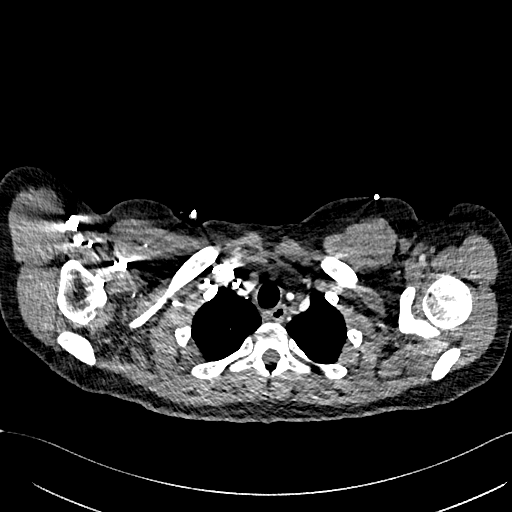
[im 397/442  lung]
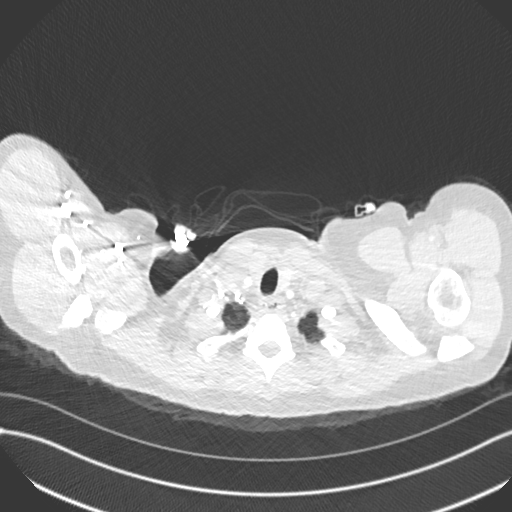
[im 419/442  mediastinal]
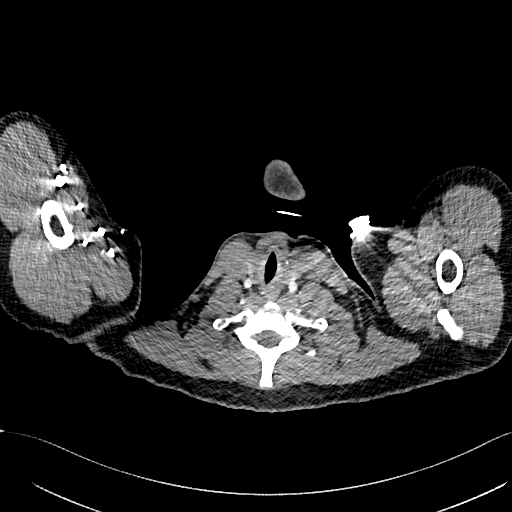

[Series 8: pe 2mm cor · coronal · 0.62mm/px · 1 of 143 slices shown]
[im 72/143  mediastinal]
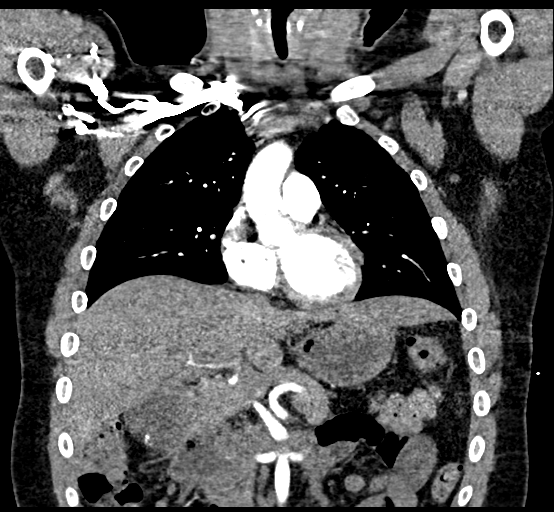

[19 of 36 positions shown; findings below may reference images not displayed]

FINDINGS: Cardiovascular: Adequate contrast opacification of the pulmonary
arteries no evidence of pulmonary embolus. Normal heart size. No
pericardial effusion. No significant coronary artery calcifications
or atherosclerotic disease of the thoracic aorta.

Mediastinum/Nodes: Mild wall thickening of the lower esophagus and
small hiatal hernia. Thyroid is unremarkable. No pathologically
enlarged lymph nodes seen in the chest.

Lungs/Pleura: Lungs are clear. No pleural effusion or pneumothorax.

Upper Abdomen: Postsurgical changes of the stomach and duodenum. No
acute abnormality.

Musculoskeletal: No chest wall abnormality. No acute or significant
osseous findings.

Review of the MIP images confirms the above findings.
IMPRESSION: 1. No evidence of pulmonary embolus.
2. Mild wall thickening of the lower esophagus, findings can be seen
in the setting of esophagitis.

## 2022-12-20 ENCOUNTER — Other Ambulatory Visit: Payer: Self-pay

## 2022-12-20 ENCOUNTER — Emergency Department: Payer: Medicare Other

## 2022-12-20 ENCOUNTER — Emergency Department
Admission: EM | Admit: 2022-12-20 | Discharge: 2022-12-20 | Disposition: A | Discharge status: Home / Self Care | Payer: Medicare Other | Attending: Emergency Medicine | Admitting: Emergency Medicine

## 2022-12-20 DIAGNOSIS — U071 COVID-19: Secondary | ICD-10-CM | POA: Insufficient documentation

## 2022-12-20 DIAGNOSIS — E860 Dehydration: Secondary | ICD-10-CM | POA: Insufficient documentation

## 2022-12-20 DIAGNOSIS — E876 Hypokalemia: Secondary | ICD-10-CM | POA: Diagnosis not present

## 2022-12-20 DIAGNOSIS — R509 Fever, unspecified: Secondary | ICD-10-CM | POA: Diagnosis present

## 2022-12-20 LAB — URINALYSIS, ROUTINE W REFLEX MICROSCOPIC
Bacteria, UA: NONE SEEN
Bilirubin Urine: NEGATIVE
Glucose, UA: 50 mg/dL — AB
Hgb urine dipstick: NEGATIVE
Ketones, ur: 20 mg/dL — AB
Leukocytes,Ua: NEGATIVE
Nitrite: NEGATIVE
Protein, ur: 100 mg/dL — AB
Specific Gravity, Urine: 1.034 — ABNORMAL HIGH (ref 1.005–1.030)
Squamous Epithelial / HPF: NONE SEEN /HPF (ref 0–5)
pH: 5 (ref 5.0–8.0)

## 2022-12-20 LAB — CBC WITH DIFFERENTIAL/PLATELET
Abs Immature Granulocytes: 0.06 10*3/uL (ref 0.00–0.07)
Basophils Absolute: 0 10*3/uL (ref 0.0–0.1)
Basophils Relative: 0 %
Eosinophils Absolute: 0 10*3/uL (ref 0.0–0.5)
Eosinophils Relative: 0 %
HCT: 48.5 % (ref 39.0–52.0)
Hemoglobin: 16.2 g/dL (ref 13.0–17.0)
Immature Granulocytes: 0 %
Lymphocytes Relative: 14 %
Lymphs Abs: 1.8 10*3/uL (ref 0.7–4.0)
MCH: 28.1 pg (ref 26.0–34.0)
MCHC: 33.4 g/dL (ref 30.0–36.0)
MCV: 84.2 fL (ref 80.0–100.0)
Monocytes Absolute: 1.3 10*3/uL — ABNORMAL HIGH (ref 0.1–1.0)
Monocytes Relative: 9 %
Neutro Abs: 10.2 10*3/uL — ABNORMAL HIGH (ref 1.7–7.7)
Neutrophils Relative %: 77 %
Platelets: 166 10*3/uL (ref 150–400)
RBC: 5.76 MIL/uL (ref 4.22–5.81)
RDW: 13.5 % (ref 11.5–15.5)
WBC: 13.4 10*3/uL — ABNORMAL HIGH (ref 4.0–10.5)
nRBC: 0 % (ref 0.0–0.2)

## 2022-12-20 LAB — RESP PANEL BY RT-PCR (RSV, FLU A&B, COVID)  RVPGX2
Influenza A by PCR: NEGATIVE
Influenza B by PCR: NEGATIVE
Resp Syncytial Virus by PCR: NEGATIVE
SARS Coronavirus 2 by RT PCR: POSITIVE — AB

## 2022-12-20 LAB — COMPREHENSIVE METABOLIC PANEL
ALT: 27 U/L (ref 0–44)
AST: 34 U/L (ref 15–41)
Albumin: 4.3 g/dL (ref 3.5–5.0)
Alkaline Phosphatase: 120 U/L (ref 38–126)
Anion gap: 12 (ref 5–15)
BUN: 16 mg/dL (ref 6–20)
CO2: 23 mmol/L (ref 22–32)
Calcium: 9 mg/dL (ref 8.9–10.3)
Chloride: 100 mmol/L (ref 98–111)
Creatinine, Ser: 0.88 mg/dL (ref 0.61–1.24)
GFR, Estimated: >60 mL/min (ref >60)
Glucose, Bld: 104 mg/dL — ABNORMAL HIGH (ref 70–99)
Potassium: 3.2 mmol/L — ABNORMAL LOW (ref 3.5–5.1)
Sodium: 135 mmol/L (ref 135–145)
Total Bilirubin: 0.9 mg/dL (ref 0.3–1.2)
Total Protein: 8.2 g/dL — ABNORMAL HIGH (ref 6.5–8.1)

## 2022-12-20 LAB — LIPASE, BLOOD: Lipase: 37 U/L (ref 11–51)

## 2022-12-20 LAB — GROUP A STREP BY PCR: Group A Strep by PCR: NOT DETECTED

## 2022-12-20 MED ORDER — ONDANSETRON 4 MG PO TBDP: 4.0000 mg | ORAL_TABLET | Freq: Three times a day (TID) | ORAL | 0 refills | Status: AC | PRN

## 2022-12-20 MED ORDER — SODIUM CHLORIDE 0.9 % IV BOLUS
1000.0000 mL | Freq: Once | INTRAVENOUS | Status: AC
  Administered 2022-12-20: 1000 mL via INTRAVENOUS

## 2022-12-20 MED ORDER — NIRMATRELVIR/RITONAVIR (PAXLOVID)TABLET: 3.0000 | ORAL_TABLET | Freq: Two times a day (BID) | ORAL | 0 refills | Status: AC

## 2022-12-20 MED ORDER — POTASSIUM CHLORIDE 20 MEQ PO PACK
20.0000 meq | PACK | Freq: Once | ORAL | Status: AC
Start: 2022-12-20 — End: 2022-12-20
  Administered 2022-12-20: 20 meq via ORAL
  Filled 2022-12-20: qty 1

## 2022-12-20 MED ORDER — ONDANSETRON HCL 4 MG/2ML IJ SOLN
4.0000 mg | Freq: Once | INTRAMUSCULAR | Status: AC
  Administered 2022-12-20: 4 mg via INTRAVENOUS
  Filled 2022-12-20: qty 2

## 2022-12-20 NOTE — Discharge Instructions (Addendum)
Follow-up with your regular doctor if not improving to 3 days.  Return emergency department worsening.  Take medications as prescribed.

## 2022-12-20 NOTE — ED Triage Notes (Signed)
Pt to ED with mother with fever since yesterday morning. Mother states not drinking much fluids and not urinating much. Vomited once this AM. Temp yesterday was 102. Last dose of nyquil was last night. Pt is wheelchair bound and has cerebral palsy.

## 2022-12-20 NOTE — ED Provider Notes (Signed)
[facility] Provider Note    Event Date/Time   First MD Initiated Contact with Patient 12/20/22 [phone]     (approximate)   History   Fever   HPI  Miguel Myers. is a 27 y.o. male with history of cerebral palsy, meningitis, hypokalemia and acid reflux presents emergency department with fever, chills, vomiting for 2 days.  Mother states he only urinated twice yesterday.  Denies diarrhea.  Is also complaining of a sore throat.  Mother indicates that he may be dehydrated      Physical Exam   Triage Vital Signs: ED Triage Vitals  Enc Vitals Group     BP 12/20/22 0750 115/74     Pulse Rate 12/20/22 0750 (!) 102     Resp 12/20/22 0750 20     Temp 12/20/22 0750 98.4 F (36.9 C)     Temp Source 12/20/22 0750 Oral     SpO2 12/20/22 0750 98 %     Weight 12/20/22 0747 140 lb (63.5 kg)     Height 12/20/22 0747 4\' 10"  (1.473 m)     Head Circumference --      Peak Flow --      Pain Score --      Pain Loc --      Pain Edu? --      Excl. in GC? --     Most recent vital signs: Vitals:   12/20/22 0750 12/20/22 1140  BP: 115/74 109/82  Pulse: (!) 102 (!) 105  Resp: 20 20  Temp: 98.4 F (36.9 C)   SpO2: 98% 95%     General: Awake, no distress.   CV:  Good peripheral perfusion. regular rate and  rhythm Resp:  Normal effort. Lungs with congestion noted anteriorly Abd:  No distention.   Other:  ENT, throat is red, neck is supple, no lymphadenopathy   ED Results / Procedures / Treatments   Labs (all labs ordered are listed, but only abnormal results are displayed) Labs Reviewed  RESP PANEL BY RT-PCR (RSV, FLU A&B, COVID)  RVPGX2 - Abnormal; Notable for the following components:      Result Value   SARS Coronavirus 2 by RT PCR POSITIVE (*)    All other components within normal limits  CBC WITH DIFFERENTIAL/PLATELET - Abnormal; Notable for the following components:   WBC 13.4 (*)    Neutro Abs 10.2 (*)    Monocytes Absolute 1.3 (*)    All  other components within normal limits  COMPREHENSIVE METABOLIC PANEL - Abnormal; Notable for the following components:   Potassium 3.2 (*)    Glucose, Bld 104 (*)    Total Protein 8.2 (*)    All other components within normal limits  URINALYSIS, ROUTINE W REFLEX MICROSCOPIC - Abnormal; Notable for the following components:   Color, Urine AMBER (*)    APPearance HAZY (*)    Specific Gravity, Urine 1.034 (*)    Glucose, UA 50 (*)    Ketones, ur 20 (*)    Protein, ur 100 (*)    All other components within normal limits  GROUP A STREP BY PCR  LIPASE, BLOOD  CBC WITH DIFFERENTIAL/PLATELET     EKG     RADIOLOGY Chest x-ray    PROCEDURES:   Procedures   MEDICATIONS ORDERED IN ED: Medications  sodium chloride 0.9 % bolus 1,000 mL (0 mLs Intravenous Stopped 12/20/22 1132)  ondansetron (ZOFRAN) injection 4 mg (4 mg Intravenous Given 12/20/22 1000)  potassium chloride (  KLOR-CON) packet 20 mEq (20 mEq Oral Given 12/20/22 1140)     IMPRESSION / MDM / ASSESSMENT AND PLAN / ED COURSE  I reviewed the triage vital signs and the nursing notes.                              Differential diagnosis includes, but is not limited to, COVID, influenza, RSV, strep throat, gastroenteritis, aspiration pneumonia, dehydration, electrolyte imbalance  Patient's presentation is most consistent with acute presentation with potential threat to life or bodily function.   Due to the dehydration concerns we will start an IV and give him fluids along with Zofran IV for vomiting.  Due to the cerebral palsy along with vomiting I do have concerns he could aspirate easily so we will do a chest x-ray  Respiratory panel, strep test and labs ordered   Respiratory panel is indicating COVID. Strep test is reassuring  Chest x-ray independently reviewed and interpreted by me as being negative.  Confirmed by radiology  Patient's CBC slightly elevated at 13.4, feel this is most likely due to the patient's  active COVID infection.  Remainder of his labs are reassuring  Patient was given 1 L normal saline.  He is feeling much better after the Zofran.  I did explain all findings to the patient and his mother.  I did consider admission but feel that the patient is stable enough to be discharged.  I feel that since he has cerebral palsy Paxlovid would be appropriate.  He was given a prescription for Paxlovid, Zofran 4 mg ODT.  They are to follow-up with his regular doctor if not improving in 4 to 5 days.  Return emergency department if worsening.  He and his mother are in agreement treatment plan.  He was discharged stable condition into her care.   FINAL CLINICAL IMPRESSION(S) / ED DIAGNOSES   Final diagnoses:  COVID  Dehydration     Rx / DC Orders   ED Discharge Orders          Ordered    nirmatrelvir/ritonavir (PAXLOVID) 20 x 150 MG & 10 x 100MG  TABS  2 times daily        12/20/22 1051    ondansetron (ZOFRAN-ODT) 4 MG disintegrating tablet  Every 8 hours PRN        12/20/22 1051             Note:  This document was prepared using Dragon voice recognition software and may include unintentional dictation errors.    Faythe Ghee, PA-C 12/20/22 1152    Sharman Cheek, MD 12/21/22 [phone]

## 2023-01-26 ENCOUNTER — Emergency Department
Admission: EM | Admit: 2023-01-26 | Discharge: 2023-01-26 | Disposition: A | Discharge status: Home / Self Care | Payer: Medicare Other | Attending: Emergency Medicine | Admitting: Emergency Medicine

## 2023-01-26 ENCOUNTER — Other Ambulatory Visit: Payer: Self-pay

## 2023-01-26 ENCOUNTER — Emergency Department: Payer: Medicare Other

## 2023-01-26 DIAGNOSIS — R110 Nausea: Secondary | ICD-10-CM | POA: Diagnosis not present

## 2023-01-26 DIAGNOSIS — Z1152 Encounter for screening for COVID-19: Secondary | ICD-10-CM | POA: Insufficient documentation

## 2023-01-26 DIAGNOSIS — R030 Elevated blood-pressure reading, without diagnosis of hypertension: Secondary | ICD-10-CM | POA: Insufficient documentation

## 2023-01-26 DIAGNOSIS — R109 Unspecified abdominal pain: Secondary | ICD-10-CM | POA: Diagnosis not present

## 2023-01-26 LAB — LIPASE, BLOOD: Lipase: 35 U/L (ref 11–51)

## 2023-01-26 LAB — COMPREHENSIVE METABOLIC PANEL
ALT: 24 U/L (ref 0–44)
AST: 30 U/L (ref 15–41)
Albumin: 4.2 g/dL (ref 3.5–5.0)
Alkaline Phosphatase: 153 U/L — ABNORMAL HIGH (ref 38–126)
Anion gap: 9 (ref 5–15)
BUN: 18 mg/dL (ref 6–20)
CO2: 25 mmol/L (ref 22–32)
Calcium: 9.7 mg/dL (ref 8.9–10.3)
Chloride: 105 mmol/L (ref 98–111)
Creatinine, Ser: 0.87 mg/dL (ref 0.61–1.24)
GFR, Estimated: >60 mL/min (ref >60)
Glucose, Bld: 96 mg/dL (ref 70–99)
Potassium: 3.9 mmol/L (ref 3.5–5.1)
Sodium: 139 mmol/L (ref 135–145)
Total Bilirubin: 0.5 mg/dL (ref 0.3–1.2)
Total Protein: 8.3 g/dL — ABNORMAL HIGH (ref 6.5–8.1)

## 2023-01-26 LAB — CBC
HCT: 51.9 % (ref 39.0–52.0)
Hemoglobin: 17.3 g/dL — ABNORMAL HIGH (ref 13.0–17.0)
MCH: 27.9 pg (ref 26.0–34.0)
MCHC: 33.3 g/dL (ref 30.0–36.0)
MCV: 83.6 fL (ref 80.0–100.0)
Platelets: 208 10*3/uL (ref 150–400)
RBC: 6.21 MIL/uL — ABNORMAL HIGH (ref 4.22–5.81)
RDW: 13.2 % (ref 11.5–15.5)
WBC: 13.2 10*3/uL — ABNORMAL HIGH (ref 4.0–10.5)
nRBC: 0 % (ref 0.0–0.2)

## 2023-01-26 LAB — URINALYSIS, ROUTINE W REFLEX MICROSCOPIC
Bilirubin Urine: NEGATIVE
Glucose, UA: 50 mg/dL — AB
Hgb urine dipstick: NEGATIVE
Ketones, ur: 5 mg/dL — AB
Leukocytes,Ua: NEGATIVE
Nitrite: NEGATIVE
Protein, ur: 100 mg/dL — AB
Specific Gravity, Urine: 1.027 (ref 1.005–1.030)
Squamous Epithelial / HPF: NONE SEEN /HPF (ref 0–5)
pH: 6 (ref 5.0–8.0)

## 2023-01-26 LAB — SARS CORONAVIRUS 2 BY RT PCR: SARS Coronavirus 2 by RT PCR: NEGATIVE

## 2023-01-26 MED ORDER — ONDANSETRON HCL 4 MG/2ML IJ SOLN
4.0000 mg | Freq: Once | INTRAMUSCULAR | Status: AC
  Administered 2023-01-26: 4 mg via INTRAVENOUS
  Filled 2023-01-26: qty 2

## 2023-01-26 MED ORDER — IOHEXOL 300 MG/ML  SOLN
100.0000 mL | Freq: Once | INTRAMUSCULAR | Status: AC | PRN
  Administered 2023-01-26: 100 mL via INTRAVENOUS

## 2023-01-26 MED ORDER — METOCLOPRAMIDE HCL 5 MG/ML IJ SOLN
10.0000 mg | Freq: Once | INTRAMUSCULAR | Status: AC
  Administered 2023-01-26: 10 mg via INTRAVENOUS
  Filled 2023-01-26: qty 2

## 2023-01-26 MED ORDER — LACTATED RINGERS IV BOLUS
1000.0000 mL | Freq: Once | INTRAVENOUS | Status: AC
  Administered 2023-01-26: 1000 mL via INTRAVENOUS

## 2023-01-26 NOTE — ED Notes (Signed)
D/c inst to mother  iv d'ced.  Pt alert.

## 2023-01-26 NOTE — ED Provider Notes (Signed)
[facility] Provider Note    Event Date/Time   First MD Initiated Contact with Patient 01/26/23 1556     (approximate)   History   Nausea   HPI  Miguel Myers. is a 27 y.o. male has medical history of cerebral palsy who presents with nausea.  For the last 3 days patient has had fairly consistent nausea.  Has not actually vomited.  Still eating but is not drinking much.  Denies any significant abdominal pain.  Denies urinary symptoms.  Has had some chills no fever.  No cough but has had some runny nose.  Has otherwise been at his baseline.  No new medications.     Past Medical History:  Diagnosis Date   Acid reflux 04/13/2012   Acute midline thoracic back pain 01/25/2021   Last Assessment & Plan:  Formatting of this note might be different from the original. Given the spinal tenderness, will obtain x-ray Will increase his baclofen to 3 times daily.  We will continue his twice daily but sent in a 30-day supply for an additional dose in the afternoon. We will do 30 days of Mobic 7.5 mg Discussed doing heat and TENS unit Order for physical therapy placed the other home   Cerebral palsy ([place])    Chalazion of left lower eyelid 05/16/2015   Coffee ground emesis 03/08/2021   Costochondritis 02/01/2019   Last Assessment & Plan:  Formatting of this note might be different from the original. HPI: - Developed chest pain on 3/21 - Has been present more at night when he is in bed - History of quadriplegic infantile cerebral palsy - Pain will come and go  - Typically will last for 15 minutes - Pain is described as a sharp pain - Rated at an 8/10 - Has not felt pain like this before - Has not had any che   Hypokalemia 03/08/2021   Impacted stool in rectum ([place]) 04/17/2020   Last Assessment & Plan:  Formatting of this note is different from the original. Constipation is best managed with a preventive approach. Pt given the following instructions in the AVS:    To prevent  constipation: 1. Take Miralax (osmotic laxative, softens stool) - 17grams mixed in a beverage (apple juice works well) once daily. May increase dose and frequency until effective   2. 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 --      Excl. in [place]? --     Most recent vital signs: Vitals:   01/26/23 1548 01/26/23 2028  BP:  127/78  Pulse:  (!) 102  Resp: 20 18  Temp:  97.8 F (36.6 C)  SpO2:  96%     General: Awake, no distress.  Patient is well-appearing CV:  Good peripheral perfusion.  Resp:  Normal effort.  Abd:  No distention.  Mild tenderness in the left lower quadrant but abdomen soft Neuro:             Awake, Alert, Oriented x 3  Other:     ED Results / Procedures / Treatments  Labs (all labs ordered are listed, but only abnormal results are displayed) Labs Reviewed  COMPREHENSIVE METABOLIC PANEL - Abnormal; Notable for the following components:      Result Value   Total Protein 8.3 (*)    Alkaline Phosphatase 153 (*)    All other components within normal limits  CBC - Abnormal; Notable for the  following components:   WBC 13.2 (*)    RBC 6.21 (*)    Hemoglobin 17.3 (*)    All other components within normal limits  URINALYSIS, ROUTINE W REFLEX MICROSCOPIC - Abnormal; Notable for the following components:   Color, Urine YELLOW (*)    APPearance HAZY (*)    Glucose, UA 50 (*)    Ketones, ur 5 (*)    Protein, ur 100 (*)    Bacteria, UA RARE (*)    All other components within normal limits  SARS CORONAVIRUS 2 BY RT PCR  LIPASE, BLOOD     EKG  EKG reviewed interpreted myself shows sinus tachycardia with right axis deviation no acute ischemic changes   RADIOLOGY   PROCEDURES:  Critical Care performed: No  Procedures  The patient is on the cardiac monitor to evaluate for evidence of arrhythmia and/or significant heart rate changes.   MEDICATIONS ORDERED IN ED: Medications  lactated ringers bolus 1,000 mL (0 mLs Intravenous Stopped 01/26/23 2121)  ondansetron (ZOFRAN) injection 4 mg (4 mg Intravenous Given 01/26/23 1831)  iohexol (OMNIPAQUE) 300 MG/ML solution 100 mL (100 mLs Intravenous Contrast Given 01/26/23 1837)  metoCLOPramide (REGLAN) injection 10 mg (10 mg Intravenous Given 01/26/23 1914)     IMPRESSION / MDM / ASSESSMENT AND PLAN / ED COURSE  I reviewed the triage vital signs and the nursing notes.                              Patient's presentation is most consistent with acute complicated illness / injury requiring diagnostic workup.  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  He has not actually vomited and is having normal BMs does not have significant associate abdominal pain.  Says he is eating does not drinking much denies urinary symptoms has had some runny nose no sick contacts.  Patient tachycardic and hypertensive on arrival.  Overall he looks well.   Has quadriplegia at baseline.  Abdominal exam does have some mild tenderness in the left lower quadrant.  Does have multiple abdominal surgical scars.  Plan to obtain CT abdomen pelvis check labs and will give a bolus of fluid and Zofran.  Labs  are notable for leukocytosis to 13.2 and hemoglobin of 17.3 likely hemoconcentration.  Electrolytes are okay.  Lipase LFTs are largely within normal limits.  Urinalysis does not suggest infection.  CT abdomen pelvis shows a chronically dilated duodenum and likely signs of enteritis versus ileus.  On repeat assessment patient does endorse some ongoing nausea but has not vomited here he is tolerating p.o. abdominal exam is benign.  I think he is appropriate to be discharged.  Does have Zofran at home.  His tachycardia improved.  Discussed clear liquid symptoms are improving.  We discussed return precautions.     FINAL CLINICAL IMPRESSION(S) / ED DIAGNOSES   Final diagnoses:  Nausea     Rx / DC Orders   ED Discharge Orders     None        Note:  This document was prepared using Dragon voice recognition software and may include unintentional dictation errors.   Rada Hay, MD 01/26/23 [phone]

## 2023-01-26 NOTE — ED Notes (Signed)
Pt waiting on iv team

## 2023-01-26 NOTE — ED Notes (Signed)
Iv team at bedside  

## 2023-01-26 NOTE — ED Notes (Signed)
ED Provider at bedside. 

## 2023-01-26 NOTE — ED Notes (Signed)
Pt father at the bedside while patient on the bedpan. Pt unable to use the bedpan so the father put the patient over his shoulder and carried him to the restroom to use the bathroom and then carried the patient back on his shoulder to the hall bed and put him back in the bed. Assistance was offered but refused.

## 2023-01-26 NOTE — ED Notes (Signed)
Pt unable to void at this time. 

## 2023-01-26 NOTE — ED Triage Notes (Signed)
Pt to ED via POV from home with mother with complaints of nausea x4 days with chills and diaphoresis. Pt denies vomiting. Pt states that he is able to eat and drink without vomiting. Pt states after meals he feels "sick". Pt denies abdominal pain. Pt in NAD. Pt HR 118 in triage.

## 2023-01-26 NOTE — ED Notes (Signed)
Pt placed on bedpan per request.

## 2023-01-26 NOTE — ED Notes (Signed)
Several attempts by different rn's without success.  Pt tolerated well.  Family with pt   md aware.

## 2023-01-26 NOTE — Discharge Instructions (Signed)
Your CAT scan shows that you likely have enteritis which is most likely from a viral infection.  Please stick to liquids until you are feeling improved.  Continue to take the Zofran.  If you have worsening abdominal pain or unable to keep fluids down please return to the emergency department.

## 2023-06-03 ENCOUNTER — Other Ambulatory Visit: Payer: Self-pay

## 2023-06-03 ENCOUNTER — Emergency Department: Payer: Medicare Other

## 2023-06-03 ENCOUNTER — Emergency Department
Admission: EM | Admit: 2023-06-03 | Discharge: 2023-06-03 | Disposition: A | Discharge status: Home / Self Care | Payer: Medicare Other | Attending: Emergency Medicine | Admitting: Emergency Medicine

## 2023-06-03 DIAGNOSIS — R109 Unspecified abdominal pain: Secondary | ICD-10-CM | POA: Diagnosis present

## 2023-06-03 DIAGNOSIS — R1032 Left lower quadrant pain: Secondary | ICD-10-CM | POA: Insufficient documentation

## 2023-06-03 LAB — COMPREHENSIVE METABOLIC PANEL
ALT: 22 U/L (ref 0–44)
AST: 26 U/L (ref 15–41)
Albumin: 4.3 g/dL (ref 3.5–5.0)
Alkaline Phosphatase: 131 U/L — ABNORMAL HIGH (ref 38–126)
Anion gap: 7 (ref 5–15)
BUN: 17 mg/dL (ref 6–20)
CO2: 23 mmol/L (ref 22–32)
Calcium: 9.1 mg/dL (ref 8.9–10.3)
Chloride: 107 mmol/L (ref 98–111)
Creatinine, Ser: 0.76 mg/dL (ref 0.61–1.24)
GFR, Estimated: >60 mL/min (ref >60)
Glucose, Bld: 99 mg/dL (ref 70–99)
Potassium: 3.8 mmol/L (ref 3.5–5.1)
Sodium: 137 mmol/L (ref 135–145)
Total Bilirubin: 0.6 mg/dL (ref 0.3–1.2)
Total Protein: 8.2 g/dL — ABNORMAL HIGH (ref 6.5–8.1)

## 2023-06-03 LAB — URINALYSIS, W/ REFLEX TO CULTURE (INFECTION SUSPECTED)
Bacteria, UA: NONE SEEN
Bilirubin Urine: NEGATIVE
Glucose, UA: 50 mg/dL — AB
Ketones, ur: 5 mg/dL — AB
Leukocytes,Ua: NEGATIVE
Nitrite: NEGATIVE
Protein, ur: NEGATIVE mg/dL
Specific Gravity, Urine: 1.018 (ref 1.005–1.030)
pH: 6 (ref 5.0–8.0)

## 2023-06-03 LAB — CBC
HCT: 53 % — ABNORMAL HIGH (ref 39.0–52.0)
Hemoglobin: 17.5 g/dL — ABNORMAL HIGH (ref 13.0–17.0)
MCH: 28.6 pg (ref 26.0–34.0)
MCHC: 33 g/dL (ref 30.0–36.0)
MCV: 86.6 fL (ref 80.0–100.0)
Platelets: 208 10*3/uL (ref 150–400)
RBC: 6.12 MIL/uL — ABNORMAL HIGH (ref 4.22–5.81)
RDW: 13.3 % (ref 11.5–15.5)
WBC: 11.7 10*3/uL — ABNORMAL HIGH (ref 4.0–10.5)
nRBC: 0 % (ref 0.0–0.2)

## 2023-06-03 LAB — LIPASE, BLOOD: Lipase: 39 U/L (ref 11–51)

## 2023-06-03 MED ORDER — IOHEXOL 300 MG/ML  SOLN
100.0000 mL | Freq: Once | INTRAMUSCULAR | Status: AC | PRN
  Administered 2023-06-03: 100 mL via INTRAVENOUS

## 2023-06-03 MED ORDER — KETOROLAC TROMETHAMINE 15 MG/ML IJ SOLN
15.0000 mg | Freq: Once | INTRAMUSCULAR | Status: AC
  Administered 2023-06-03: 15 mg via INTRAVENOUS
  Filled 2023-06-03: qty 1

## 2023-06-03 NOTE — ED Triage Notes (Signed)
Pt to Ed via POV from home. Pt reports left sided abdominal pain that started a week ago. Pt denies radiation. Pt reports increased urinary frequency. Pt denies N/V/D.

## 2023-06-03 NOTE — Discharge Instructions (Addendum)
You were seen in the emergency department for abdominal pain.  You had a CT scan that did not show any abnormalities.  Your lab work was overall normal.  You can take Motrin and Tylenol for pain control.  Follow-up closely with your primary care physician.

## 2023-06-03 NOTE — ED Notes (Signed)
Patient transported to CT 

## 2023-06-03 NOTE — ED Provider Notes (Signed)
[facility] Provider Note    Event Date/Time   First MD Initiated Contact with Patient 06/03/23 1804     (approximate)   History   Abdominal Pain   HPI  Miguel Myers. is a 27 y.o. male past medical history significant for cerebral palsy, who presents to the emergency department with left-sided abdominal pain.  Left-sided abdominal pain that has been ongoing for the past 1 week.  Nothing improves or worsens the pain.  Normal bowel movements.  No association with nausea, vomiting or diarrhea.  Does endorse increased urine output over the past 1 week and has been urinating frequently.  No history of diabetes.  No history of bowel obstruction.     Physical Exam   Triage Vital Signs: ED Triage Vitals  Encounter Vitals Group     BP 06/03/23 1714 124/81     Systolic BP Percentile --      Diastolic BP Percentile --      Pulse Rate 06/03/23 1714 (!) 101     Resp 06/03/23 1714 18     Temp 06/03/23 1714 98.5 F (36.9 C)     Temp Source 06/03/23 1714 Oral     SpO2 06/03/23 1714 93 %     Weight 06/03/23 1715 130 lb (59 kg)     Height 06/03/23 1715 4\' 10"  (1.473 m)     Head Circumference --      Peak Flow --      Pain Score 06/03/23 1715 10     Pain Loc --      Pain Education --      Exclude from Growth Chart --     Most recent vital signs: Vitals:   06/03/23 1714  BP: 124/81  Pulse: (!) 101  Resp: 18  Temp: 98.5 F (36.9 C)  SpO2: 93%    Physical Exam Constitutional:      Appearance: He is well-developed.  HENT:     Head: Atraumatic.  Eyes:     Conjunctiva/sclera: Conjunctivae normal.  Cardiovascular:     Rate and Rhythm: Regular rhythm.  Pulmonary:     Effort: No respiratory distress.  Abdominal:     Tenderness: There is abdominal tenderness in the left lower quadrant.  Musculoskeletal:     Cervical back: Normal range of motion.  Skin:    General: Skin is warm.     Capillary Refill: Capillary refill takes less than 2 seconds.   Neurological:     General: No focal deficit present.     Mental Status: He is alert. Mental status is at baseline.     Comments: Contractures to the bilateral upper extremities     IMPRESSION / MDM / ASSESSMENT AND PLAN / ED COURSE  I reviewed the triage vital signs and the nursing notes.  Differential diagnosis including pyelonephritis, kidney stone, bowel obstruction, diverticulitis    RADIOLOGY my interpretation of imaging: CT scan abdomen and pelvis without acute intra-abdominal pathology.  No signs of hydronephrosis.  Nonobstructing kidney stone on the right side.  LABS (all labs ordered are listed, but only abnormal results are displayed) Labs interpreted as -    Labs Reviewed  COMPREHENSIVE METABOLIC PANEL - Abnormal; Notable for the following components:      Result Value   Total Protein 8.2 (*)    Alkaline Phosphatase 131 (*)    All other components within normal limits  CBC - Abnormal; Notable for the following components:   WBC 11.7 (*)  RBC 6.12 (*)    Hemoglobin 17.5 (*)    HCT 53.0 (*)    All other components within normal limits  LIPASE, BLOOD  URINALYSIS, W/ REFLEX TO CULTURE (INFECTION SUSPECTED)     MDM    Mild leukocytosis.  Hemoglobin 17.5 but appears chronic.  Normal lipase and LFTs.  UA currently in process.  No significant hyperglycemia.  CT scan of the abdomen and pelvis without acute intra-abdominal pathology.  CT scan Noncon was obtained given infiltration of his IV.   PROCEDURES:  Critical Care performed: No  Ultrasound ED Peripheral IV (Provider)  Date/Time: 06/03/2023 7:24 PM  Performed by: Corena Herter, MD Authorized by: Corena Herter, MD   Procedure details:    Indications: multiple failed IV attempts     Skin Prep: chlorhexidine gluconate     Location:  Right AC   Angiocath:  20 G   Bedside Ultrasound Guided: Yes     Images: not archived     Patient tolerated procedure without complications: Yes     Dressing applied:  Yes     Patient's presentation is most consistent with acute complicated illness / injury requiring diagnostic workup.   MEDICATIONS ORDERED IN ED: Medications  ketorolac (TORADOL) 15 MG/ML injection 15 mg (15 mg Intravenous Given 06/03/23 1932)  iohexol (OMNIPAQUE) 300 MG/ML solution 100 mL (100 mLs Intravenous Contrast Given 06/03/23 1936)    FINAL CLINICAL IMPRESSION(S) / ED DIAGNOSES   Final diagnoses:  Left lower quadrant abdominal pain     Rx / DC Orders   ED Discharge Orders     None        Note:  This document was prepared using Dragon voice recognition software and may include unintentional dictation errors.   Corena Herter, MD 06/03/23 2029
